# Patient Record
Sex: Female | Born: 1965 | Race: White | Hispanic: No | Marital: Married | Smoking: Never smoker
Health system: Southern US, Community
[De-identification: ages and names within clinical notes are randomized; demographics above are authoritative.]

## PROBLEM LIST (undated history)

## (undated) DIAGNOSIS — D649 Anemia, unspecified: Secondary | ICD-10-CM

## (undated) DIAGNOSIS — E042 Nontoxic multinodular goiter: Secondary | ICD-10-CM

## (undated) DIAGNOSIS — D219 Benign neoplasm of connective and other soft tissue, unspecified: Secondary | ICD-10-CM

## (undated) DIAGNOSIS — F419 Anxiety disorder, unspecified: Secondary | ICD-10-CM

## (undated) HISTORY — DX: Anxiety disorder, unspecified: F41.9

## (undated) HISTORY — DX: Benign neoplasm of connective and other soft tissue, unspecified: D21.9

## (undated) HISTORY — PX: DILATION AND CURETTAGE OF UTERUS: SHX78

## (undated) HISTORY — PX: HYSTEROSCOPY: SHX211

## (undated) HISTORY — DX: Nontoxic multinodular goiter: E04.2

---

## 1997-09-01 ENCOUNTER — Other Ambulatory Visit: Admission: RE | Admit: 1997-09-01 | Discharge: 1997-09-01 | Payer: Self-pay | Admitting: Gynecology

## 1999-06-08 ENCOUNTER — Other Ambulatory Visit: Admission: RE | Admit: 1999-06-08 | Discharge: 1999-06-08 | Payer: Self-pay | Admitting: Gynecology

## 1999-07-03 ENCOUNTER — Encounter: Admission: RE | Admit: 1999-07-03 | Discharge: 1999-07-03 | Payer: Self-pay | Admitting: Gynecology

## 1999-07-03 ENCOUNTER — Encounter: Payer: Self-pay | Admitting: Gynecology

## 2000-07-30 ENCOUNTER — Other Ambulatory Visit: Admission: RE | Admit: 2000-07-30 | Discharge: 2000-07-30 | Payer: Self-pay | Admitting: Gynecology

## 2001-11-11 ENCOUNTER — Other Ambulatory Visit: Admission: RE | Admit: 2001-11-11 | Discharge: 2001-11-11 | Payer: Self-pay | Admitting: Gynecology

## 2002-12-28 ENCOUNTER — Other Ambulatory Visit: Admission: RE | Admit: 2002-12-28 | Discharge: 2002-12-28 | Payer: Self-pay | Admitting: Gynecology

## 2004-01-03 ENCOUNTER — Other Ambulatory Visit: Admission: RE | Admit: 2004-01-03 | Discharge: 2004-01-03 | Payer: Self-pay | Admitting: Gynecology

## 2004-02-18 ENCOUNTER — Ambulatory Visit (HOSPITAL_COMMUNITY): Admission: RE | Admit: 2004-02-18 | Discharge: 2004-02-18 | Payer: Self-pay | Admitting: Dermatology

## 2004-02-18 ENCOUNTER — Ambulatory Visit (HOSPITAL_BASED_OUTPATIENT_CLINIC_OR_DEPARTMENT_OTHER): Admission: RE | Admit: 2004-02-18 | Discharge: 2004-02-18 | Payer: Self-pay | Admitting: Dermatology

## 2004-02-18 ENCOUNTER — Encounter (INDEPENDENT_AMBULATORY_CARE_PROVIDER_SITE_OTHER): Payer: Self-pay | Admitting: Specialist

## 2004-03-02 ENCOUNTER — Encounter: Admission: RE | Admit: 2004-03-02 | Discharge: 2004-03-02 | Payer: Self-pay | Admitting: Gynecology

## 2005-01-30 ENCOUNTER — Other Ambulatory Visit: Admission: RE | Admit: 2005-01-30 | Discharge: 2005-01-30 | Payer: Self-pay | Admitting: Gynecology

## 2005-03-21 ENCOUNTER — Encounter: Admission: RE | Admit: 2005-03-21 | Discharge: 2005-03-21 | Payer: Self-pay | Admitting: Gynecology

## 2006-04-01 ENCOUNTER — Encounter: Admission: RE | Admit: 2006-04-01 | Discharge: 2006-04-01 | Payer: Self-pay | Admitting: Gynecology

## 2007-04-09 ENCOUNTER — Encounter: Admission: RE | Admit: 2007-04-09 | Discharge: 2007-04-09 | Payer: Self-pay | Admitting: Obstetrics and Gynecology

## 2007-04-16 ENCOUNTER — Encounter: Admission: RE | Admit: 2007-04-16 | Discharge: 2007-04-16 | Payer: Self-pay | Admitting: Obstetrics and Gynecology

## 2007-11-20 ENCOUNTER — Encounter: Admission: RE | Admit: 2007-11-20 | Discharge: 2007-11-20 | Payer: Self-pay | Admitting: Obstetrics and Gynecology

## 2008-04-09 ENCOUNTER — Encounter: Admission: RE | Admit: 2008-04-09 | Discharge: 2008-04-09 | Payer: Self-pay | Admitting: Obstetrics and Gynecology

## 2009-04-18 ENCOUNTER — Encounter: Admission: RE | Admit: 2009-04-18 | Discharge: 2009-04-18 | Payer: Self-pay | Admitting: Obstetrics and Gynecology

## 2010-05-11 ENCOUNTER — Other Ambulatory Visit: Payer: Self-pay | Admitting: Obstetrics and Gynecology

## 2010-05-11 DIAGNOSIS — Z1231 Encounter for screening mammogram for malignant neoplasm of breast: Secondary | ICD-10-CM

## 2010-05-30 ENCOUNTER — Ambulatory Visit
Admission: RE | Admit: 2010-05-30 | Discharge: 2010-05-30 | Disposition: A | Discharge status: Home / Self Care | Payer: BC Managed Care – PPO | Source: Ambulatory Visit | Attending: Obstetrics and Gynecology | Admitting: Obstetrics and Gynecology

## 2010-05-30 DIAGNOSIS — Z1231 Encounter for screening mammogram for malignant neoplasm of breast: Secondary | ICD-10-CM

## 2010-08-18 NOTE — Op Note (Signed)
[name], Mikayla Greer          ACCOUNT NO.:  [id number]   MEDICAL RECORD NO.:  [id number]          PATIENT TYPE:  AMB   LOCATION:  [facility]                         FACILITY:  [facility]   PHYSICIAN:  Gretta Cool, M.D. DATE OF BIRTH:  16-Jun-1965   DATE OF PROCEDURE:  02/18/2004  DATE OF DISCHARGE:                                 OPERATIVE REPORT   PREOPERATIVE DIAGNOSIS:  Abnormal uterine bleeding with endometrial polyps  and submucous leiomyomata.   POSTOPERATIVE DIAGNOSIS:  Abnormal uterine bleeding with endometrial polyps  and submucous leiomyomata.   PROCEDURE:  Hysteroscopy with resection of endometrial polyps and multiple  uterine leiomyomata.   SURGEON:  Gretta Cool, M.D.   ANESTHESIA:  IV sedation, paracervical block.   DESCRIPTION OF PROCEDURE:  Under excellent anesthesia, as above, with the  patient prepped and draped in Allen stirrups in the lithotomy position, the  cervix was progressively dilated with a series of Pratt dilators to  accommodate the 7 mm resectoscope.  The cavity was then examined with the  resectoscope and photographed.  Endometrial polyps were identified near the  fundus of the uterus.  The endometrial polyps were progressively resected  from anterior and cornual area to eventually posterior.  During the course  of resection of multiple polyps, multiple fibroids, varying from pearl-sized  to a very large fibroid on the posterior fundal wall measuring at least 2-3  cm across.  The fibroid was progressively resected and continued to bulge  out into the endometrial cavity.  Resection was continued inside the capsule  until very thin layer of fibroid remained.  At this point, rather than risk  perforation of the uterus through the very apex of the fundus, the fibroid  was treated by cautery, so as to destroy by heat transfer of the fibroid, in  hopes of reaching 110 degrees throughout the fibroid without perforating the  uterus.  Once the fibroid  was so treated, the entire endometrial cavity was  treated with VaporTrode with touch technique over the cornual areas.  The  entire endometrial cavity was then examined at reduced pressure, and  bleeding points treated by cautery.  At the end of the procedure, the  bleeding was minimal at 30 mm pressure.  The fluid deficit was less than 70  cc.   COMPLICATIONS:  None.      CWL/MEDQ  D:  02/18/2004  T:  02/18/2004  Job:  [id number]   cc:   [facility]

## 2011-04-26 ENCOUNTER — Other Ambulatory Visit: Payer: Self-pay | Admitting: Obstetrics and Gynecology

## 2011-04-26 DIAGNOSIS — Z1231 Encounter for screening mammogram for malignant neoplasm of breast: Secondary | ICD-10-CM

## 2011-06-05 ENCOUNTER — Ambulatory Visit
Admission: RE | Admit: 2011-06-05 | Discharge: 2011-06-05 | Disposition: A | Discharge status: Home / Self Care | Payer: PRIVATE HEALTH INSURANCE | Source: Ambulatory Visit | Attending: Obstetrics and Gynecology | Admitting: Obstetrics and Gynecology

## 2011-06-05 DIAGNOSIS — Z1231 Encounter for screening mammogram for malignant neoplasm of breast: Secondary | ICD-10-CM

## 2012-05-20 ENCOUNTER — Other Ambulatory Visit: Payer: Self-pay | Admitting: Obstetrics and Gynecology

## 2012-05-20 DIAGNOSIS — Z1231 Encounter for screening mammogram for malignant neoplasm of breast: Secondary | ICD-10-CM

## 2012-05-28 ENCOUNTER — Other Ambulatory Visit: Payer: Self-pay

## 2012-05-28 DIAGNOSIS — Z1231 Encounter for screening mammogram for malignant neoplasm of breast: Secondary | ICD-10-CM

## 2012-06-09 ENCOUNTER — Ambulatory Visit: Payer: PRIVATE HEALTH INSURANCE

## 2012-06-25 ENCOUNTER — Ambulatory Visit: Payer: PRIVATE HEALTH INSURANCE

## 2012-07-22 ENCOUNTER — Ambulatory Visit
Admission: RE | Admit: 2012-07-22 | Discharge: 2012-07-22 | Disposition: A | Discharge status: Home / Self Care | Payer: BC Managed Care – PPO | Source: Ambulatory Visit

## 2012-07-22 DIAGNOSIS — Z1231 Encounter for screening mammogram for malignant neoplasm of breast: Secondary | ICD-10-CM

## 2012-10-09 ENCOUNTER — Ambulatory Visit: Payer: Self-pay | Admitting: Obstetrics and Gynecology

## 2012-10-17 ENCOUNTER — Ambulatory Visit (INDEPENDENT_AMBULATORY_CARE_PROVIDER_SITE_OTHER): Payer: BC Managed Care – PPO | Admitting: Obstetrics and Gynecology

## 2012-10-17 ENCOUNTER — Encounter: Payer: Self-pay | Admitting: Obstetrics and Gynecology

## 2012-10-17 VITALS — BP 120/80 | HR 80 | Ht 65.0 in | Wt 146.0 lb

## 2012-10-17 DIAGNOSIS — Z0000 Encounter for general adult medical examination without abnormal findings: Secondary | ICD-10-CM

## 2012-10-17 DIAGNOSIS — D219 Benign neoplasm of connective and other soft tissue, unspecified: Secondary | ICD-10-CM

## 2012-10-17 DIAGNOSIS — Z01419 Encounter for gynecological examination (general) (routine) without abnormal findings: Secondary | ICD-10-CM

## 2012-10-17 DIAGNOSIS — D259 Leiomyoma of uterus, unspecified: Secondary | ICD-10-CM

## 2012-10-17 DIAGNOSIS — N920 Excessive and frequent menstruation with regular cycle: Secondary | ICD-10-CM

## 2012-10-17 LAB — CBC
HCT: 31.3 % — ABNORMAL LOW (ref 36.0–46.0)
MCH: 25.6 pg — ABNORMAL LOW (ref 26.0–34.0)
MCV: 81.7 fL (ref 78.0–100.0)
RBC: 3.83 MIL/uL — ABNORMAL LOW (ref 3.87–5.11)
RDW: 16.1 % — ABNORMAL HIGH (ref 11.5–15.5)
WBC: 6 10*3/uL (ref 4.0–10.5)

## 2012-10-17 LAB — COMPREHENSIVE METABOLIC PANEL
ALT: 11 U/L (ref 0–35)
Albumin: 4.4 g/dL (ref 3.5–5.2)
Alkaline Phosphatase: 51 U/L (ref 39–117)
Potassium: 4.3 mEq/L (ref 3.5–5.3)
Sodium: 138 mEq/L (ref 135–145)
Total Bilirubin: 0.6 mg/dL (ref 0.3–1.2)
Total Protein: 6.5 g/dL (ref 6.0–8.3)

## 2012-10-17 LAB — LIPID PANEL
HDL: 83 mg/dL (ref >39)
LDL Cholesterol: 101 mg/dL — ABNORMAL HIGH (ref 0–99)
Total CHOL/HDL Ratio: 2.3 Ratio

## 2012-10-17 LAB — POCT URINALYSIS DIPSTICK
Blood, UA: NEGATIVE
Glucose, UA: NEGATIVE
Nitrite, UA: NEGATIVE
Urobilinogen, UA: NEGATIVE
pH, UA: 5

## 2012-10-17 NOTE — Patient Instructions (Signed)

## 2012-10-17 NOTE — Progress Notes (Signed)
Patient ID: Mikayla Greer, female   DOB: 29-Dec-1965, 47 y.o.   MRN: [id number] 47 y.o.   Married    Caucasian   female   G1P1001   here for annual exam.   Menses very heavy.  Light menses April.  None in May.  June and July heavy flow with clots.  Has one day of sitting basically on the toilet.  Cannot even use tampons.  Cannot drive to work without bleeding through.   Takes Advil for the cramps.  This does not help the bleeding.  Had a hysteroscopy with myomectomy 6 - 7 years ago.  Hot flashes and night sweats.  Drenches at night. Jeffie Pollock does help this.    Lot 10 pounds on purpose.  Running and walking.  Patient's last menstrual period was 10/08/2012.          Sexually active: yes  The current method of family planning is vasectomy.    Exercising: walking and running Last mammogram:  05/2012 wnl:[facility] Last pap smear: 09/2011 wnl History of abnormal pap: no Smoking: no Alcohol: 1 beer per week Last colonoscopy: never Last Bone Density:  never Last tetanus shot: 02/2012 Last cholesterol check: 2-3 yrs ago:[facility]  Hgb:                Urine: Neg   Family History  Problem Relation Age of Onset  . Rheum arthritis Mother   . Breast cancer Paternal Grandmother     There are no active problems to display for this patient.   Past Medical History  Diagnosis Date  . Anxiety   . Fibroid     Past Surgical History  Procedure Laterality Date  . Hysteroscopy      Allergies: Review of patient's allergies indicates not on file.  Current Outpatient Prescriptions  Medication Sig Dispense Refill  . ALPRAZolam (XANAX) 0.5 MG tablet Take 1 tablet by mouth as needed.      . calcium carbonate (OS-CAL) 600 MG TABS Take 600 mg by mouth 2 (two) times daily with a meal.      . escitalopram (LEXAPRO) 20 MG tablet Take 1 tablet by mouth daily.      . Multiple Vitamin (MULTIVITAMIN) capsule Take 1 capsule by mouth daily.      . Nutritional Supplements (ESTROVEN ENERGY PO) Take  1 capsule by mouth 2 (two) times daily.       No current facility-administered medications for this visit.    ROS: Pertinent items are noted in HPI.  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  Exam:    BP 120/80  Pulse 80  Ht 5\' 5"  (1.651 m)  Wt 146 lb (66.225 kg)  BMI 24.3 kg/m2  LMP 10/08/2012   Wt Readings from Last 3 Encounters:  10/17/12 146 lb (66.225 kg)     Ht Readings from Last 3 Encounters:  10/17/12 5\' 5"  (1.651 m)    General appearance: alert, cooperative and appears stated age Head: Normocephalic, without obvious abnormality, atraumatic Neck: no adenopathy, supple, symmetrical, trachea midline and thyroid not enlarged, symmetric, no tenderness/mass/nodules Lungs: clear to auscultation bilaterally Breasts: Inspection negative, No nipple retraction or dimpling, No nipple discharge or bleeding, No axillary or supraclavicular adenopathy, Normal to palpation without dominant masses Heart: regular rate and rhythm Abdomen: soft, non-tender; no masses,  no organomegaly Extremities: extremities normal, atraumatic, no cyanosis or edema Skin: Skin color, texture, turgor normal. No rashes or lesions Lymph nodes: Cervical, supraclavicular, and axillary nodes normal. No abnormal  inguinal nodes palpated Neurologic: Grossly normal   Pelvic: External genitalia:  no lesions              Urethra:  normal appearing urethra with no masses, tenderness or lesions              Bartholins and Skenes: normal                 Vagina: normal appearing vagina with normal color and discharge, no lesions              Cervix: normal appearance              Pap taken: no        Bimanual Exam:  Uterus:  uterus is  10 week size - retroverted.  Large posterior fibroids noted above cervix.                                      Adnexa: normal adnexa in size, nontender and no masses                                      Rectovaginal: Confirms                                      Anus:  normal  sphincter tone, no lesions  Assessment Menorrhagia. Uterine fibroids.     P: mammogram yearly pap smear in 4 years TSH, CBC, lipid profile, CMP Return for pelvic ultrasound and possible sonohysterogram and endometrial biopsy return annually or prn     An After Visit Summary was printed and given to the patient.

## 2012-10-22 ENCOUNTER — Telehealth: Payer: Self-pay

## 2012-10-22 NOTE — Telephone Encounter (Signed)
LMOVM at HM# and Cell # to call for test results. 

## 2012-10-22 NOTE — Telephone Encounter (Signed)
Message copied by Alphonsa Overall on Wed Oct 22, 2012 10:23 AM ------      Message from: Conley Simmonds      Created: Mon Oct 20, 2012  8:35 AM       Please inform patient of results.            She is anemic due to her heavy menstrual bleeding.  The rest of the blood work is normal.            I recommend over the counter iron sulfate 325 mg by mouth twice a day with meals.            She will return for her pelvic ultrasound to investigate her fibroids further. ------

## 2012-10-22 NOTE — Telephone Encounter (Signed)
Patient notified of anemia and advised to begin ferrous sulfate 325mg  twice daily and keep ultrasound appt.

## 2012-10-22 NOTE — Telephone Encounter (Signed)
Returned call

## 2012-10-27 ENCOUNTER — Telehealth: Payer: Self-pay | Admitting: Obstetrics and Gynecology

## 2012-10-27 NOTE — Telephone Encounter (Signed)
LMTCB to discuss insurance benefits for pus-shgm/endo bx and to schedule.

## 2012-11-11 ENCOUNTER — Telehealth: Payer: Self-pay | Admitting: Obstetrics and Gynecology

## 2012-11-11 NOTE — Telephone Encounter (Signed)
Patient has an appointment on Thursday  for Ultrasound . Last period was July. Needs to keep this appointment or what should she do?

## 2012-11-12 ENCOUNTER — Telehealth: Payer: Self-pay | Admitting: Obstetrics and Gynecology

## 2012-11-12 NOTE — Telephone Encounter (Signed)
Call to patient, LMTCB regarding SHGM appt.  47 y/o with menopausal symptoms and irregular heavy bleeding.  Husband with vasectomy.  Sched for [facility] tomm.

## 2012-11-12 NOTE — Telephone Encounter (Signed)
Patient needs to know if she needs to keep her appointment for her Ultrasound . Because she hasn't started her period . Needs to know something today . Appointment scheduled for tomorrow at 8:30am.

## 2012-11-12 NOTE — Telephone Encounter (Signed)
Left message on CB#VM of need to return call concerning appt. Tomorrow.

## 2012-11-12 NOTE — Telephone Encounter (Signed)
Patient returned my call, has not started menses and was concerned that she wasn't on the correct cycle day to have procedure.  Per Dr Edward Jolly, [facility] to have procedure as scheduled.

## 2012-11-12 NOTE — Telephone Encounter (Signed)
Patient calling to let know that she has not started her cycle for this month and no symptoms as of now that she is . Patient advised to keep appt. For tomorrow for procedures to be done. Patient to call in morning if starts her cycle.

## 2012-11-13 ENCOUNTER — Ambulatory Visit (INDEPENDENT_AMBULATORY_CARE_PROVIDER_SITE_OTHER): Payer: BC Managed Care – PPO

## 2012-11-13 ENCOUNTER — Encounter: Payer: Self-pay | Admitting: Obstetrics and Gynecology

## 2012-11-13 ENCOUNTER — Ambulatory Visit (INDEPENDENT_AMBULATORY_CARE_PROVIDER_SITE_OTHER): Payer: BC Managed Care – PPO | Admitting: Obstetrics and Gynecology

## 2012-11-13 VITALS — BP 120/76 | HR 80 | Ht 65.0 in | Wt 150.5 lb

## 2012-11-13 DIAGNOSIS — N920 Excessive and frequent menstruation with regular cycle: Secondary | ICD-10-CM

## 2012-11-13 DIAGNOSIS — D219 Benign neoplasm of connective and other soft tissue, unspecified: Secondary | ICD-10-CM

## 2012-11-13 DIAGNOSIS — D259 Leiomyoma of uterus, unspecified: Secondary | ICD-10-CM

## 2012-11-13 NOTE — Progress Notes (Signed)
Transvaginal ultrasound

## 2012-11-13 NOTE — Patient Instructions (Signed)
Our precertification department will contact you after we receive authorization for the hysterectomy from your insurance company.

## 2012-11-13 NOTE — Progress Notes (Signed)
Patient ID: Mikayla Greer, female   DOB: Oct 16, 1965, 47 y.o.   MRN: [id number]  Subjective  Patient here for pelvic ultrasound, sonohysterogram, and endometrial biopsy. Menorrhagia and anemia. History of uterine fibroids.  Is status post hysteroscopic myomectomy. Has daily pelvic pressure. Notes dsypareunia.    Objective    See ultrasound report  Uterus with multiple fibroids, larges in 4.6 cm posterior and distorts endometrial cavity. EMS 10.73 mm - distorted and cannot rule out endometrial mass 15 mm. Right ovary with thick walled cyst 3.6 cm, echo free and avascular. Left ovary with multicystic mass 4.8 cm avascular and calcifications and low level echos suggestive of dermoid cyst.  Procedures  Consent for sonohysterogram and endometrial biopsy  Open sided speculum. Sterile prep of the cervix with betadine. Tenaculum to anterior cervix. Attempt to pass canula for saline infusion, met with resistance. Attempt to pass Pipelle also met with resistance and caused vaginal bleeding.  Both sonohysterogram and endometrial biopsies aborted.  Assessment  Multifibroid uterus - symptomatic. Anemia.  Bilateral ovarian cysts, possible left dermoid cyst.  Plan  I discussed options for care with the patient.   Will plan for abdominal hysterectomy with possible bilateral salpingo-oophorectomy.  Risks, benefits, and alternatives discussed with the patient.  The posterior uterine fibroid is distorting the uterus so that it would be difficult to place a uterine manipulator in order to perform the surgery laparoscopically.  I have discussed risks which include but are not limited to bleeding, infection, trauma to surrounding organs, DVT, PE, reaction to anesthesia, death, need for reoperation, and menopausal symptoms if hormone therapy were not given post op.  Patient would like to proceed.

## 2012-12-02 ENCOUNTER — Encounter: Payer: Self-pay | Admitting: Gynecology

## 2012-12-04 ENCOUNTER — Telehealth: Payer: Self-pay | Admitting: *Deleted

## 2012-12-04 NOTE — Telephone Encounter (Signed)
Patient notified of surgery date of 02-03-13 at 0730 at [facility].  Instructions given and copy mailed. Pre/post op appts scheduled.  What bowel prep for this patient?

## 2012-12-05 NOTE — Telephone Encounter (Signed)
No bowel prep needed.

## 2012-12-08 NOTE — Telephone Encounter (Signed)
VM confirms "Rocky Link and Jen Mow", LM on VM, Dr Rica Records office calling to clarify no bowel prep needed.  Call back if questions.

## 2013-01-12 ENCOUNTER — Ambulatory Visit (INDEPENDENT_AMBULATORY_CARE_PROVIDER_SITE_OTHER): Payer: BC Managed Care – PPO | Admitting: Obstetrics and Gynecology

## 2013-01-12 ENCOUNTER — Encounter: Payer: Self-pay | Admitting: Obstetrics and Gynecology

## 2013-01-12 VITALS — BP 110/68 | HR 84 | Ht 65.0 in | Wt 151.5 lb

## 2013-01-12 DIAGNOSIS — D219 Benign neoplasm of connective and other soft tissue, unspecified: Secondary | ICD-10-CM

## 2013-01-12 DIAGNOSIS — D259 Leiomyoma of uterus, unspecified: Secondary | ICD-10-CM

## 2013-01-12 DIAGNOSIS — E041 Nontoxic single thyroid nodule: Secondary | ICD-10-CM | POA: Insufficient documentation

## 2013-01-12 LAB — IRON: Iron: 33 ug/dL — ABNORMAL LOW (ref 42–145)

## 2013-01-12 LAB — CBC
HCT: 33.6 % — ABNORMAL LOW (ref 36.0–46.0)
Hemoglobin: 10.7 g/dL — ABNORMAL LOW (ref 12.0–15.0)
RBC: 4.09 MIL/uL (ref 3.87–5.11)
WBC: 4.9 10*3/uL (ref 4.0–10.5)

## 2013-01-12 NOTE — Progress Notes (Signed)
Patient ID: Mikayla Greer, female   DOB: Oct 24, 1965, 47 y.o.   MRN: [id number] GYNECOLOGY PROBLEM VISIT  PCP:  [facility]  Referring provider:   HPI: 47 y.o.   Married  Caucasian  female   G1P1001 with LMP 12/20/12, very heavy.   here for Discuss surgery on 02/03/13  Scheduled for a total abdominal hysterectomy with possible bilateral salpingo-oophorectomy. Heavy cycles and known fibroids. History of hysteroscopic myomectomy. Vasectomy for birth control.   Hgb 9.8 in July.  Taking some iron but has GI upset from this.  Was taking ferrous sulfate.    Ultrasound, sonohysterogram and endometrial biopsy attempt on 11/13/12 -   Uterus with multiple fibroids, larges in 4.6 cm posterior and distorts endometrial cavity.  EMS 10.73 mm - distorted and cannot rule out endometrial mass 15 mm.  Right ovary with thick walled cyst 3.6 cm, echo free and avascular.  Left ovary with multicystic mass 4.8 cm avascular and calcifications and low level echos suggestive of dermoid cyst.  No EMP possible secondary to distortion of the endometrial cavity.   Sometimes had urinary incontinence if coughs hard.    GYNECOLOGIC HISTORY: LMP: 12-20-12 Sexually active:  yes Partner preference: female Contraception:   vasectomy Menopausal hormone therapy: no DES exposure:  no  Blood transfusions:  no  Sexually transmitted diseases:   no GYN Procedures:  Hysteroscopy 2006 Mammogram:  05/2012 wnl:[facility]              Pap: 09/2011 wnl   History of abnormal pap smear:  no   OB History   Grav Para Term Preterm Abortions TAB SAB Ect Mult Living   1 1 1       1          Family History  Problem Relation Age of Onset  . Rheum arthritis Mother   . Breast cancer Paternal Grandmother     There are no active problems to display for this patient.   Past Medical History  Diagnosis Date  . Anxiety   . Fibroid     Past Surgical History  Procedure Laterality Date  . Hysteroscopy       ALLERGIES: Review of patient's allergies indicates no known allergies.  Current Outpatient Prescriptions  Medication Sig Dispense Refill  . ALPRAZolam (XANAX) 0.5 MG tablet Take 1 tablet by mouth as needed.      . calcium carbonate (OS-CAL) 600 MG TABS Take 600 mg by mouth 2 (two) times daily with a meal.      . escitalopram (LEXAPRO) 20 MG tablet Take 1 tablet by mouth daily.      . ferrous sulfate 325 (65 FE) MG tablet Take 325 mg by mouth at bedtime.      . Multiple Vitamin (MULTIVITAMIN) capsule Take 1 capsule by mouth daily.      . Nutritional Supplements (ESTROVEN ENERGY PO) Take 1 capsule by mouth 2 (two) times daily.       No current facility-administered medications for this visit.     ROS:  Pertinent items are noted in HPI.  SOCIAL HISTORY:  Married.   PHYSICAL EXAMINATION:    BP 110/68  Pulse 84  Ht 5\' 5"  (1.651 m)  Wt 151 lb 8 oz (68.72 kg)  BMI 25.21 kg/m2   Wt Readings from Last 3 Encounters:  01/12/13 151 lb 8 oz (68.72 kg)  11/13/12 150 lb 8 oz (68.266 kg)  10/17/12 146 lb (66.225 kg)     Ht Readings  from Last 3 Encounters:  01/12/13 5\' 5"  (1.651 m)  11/13/12 5\' 5"  (1.651 m)  10/17/12 5\' 5"  (1.651 m)    General appearance: alert, cooperative and appears stated age Head: Normocephalic, without obvious abnormality, atraumatic Neck: no adenopathy, supple, symmetrical, trachea midline and thyroid not enlarged, symmetric,  2.0 cm right thyroid nodule noted, nontender.  Lungs: clear to auscultation bilaterally Breasts: Inspection negative, No nipple retraction or dimpling, No nipple discharge or bleeding, No axillary or supraclavicular adenopathy, Normal to palpation without dominant masses Heart: regular rate and rhythm Abdomen: soft, non-tender; no masses,  no organomegaly Extremities: extremities normal, atraumatic, no cyanosis or edema Skin: Skin color, texture, turgor normal. No rashes or lesions Lymph nodes: Cervical, supraclavicular, and axillary  nodes normal. No abnormal inguinal nodes palpated Neurologic: Grossly normal  Pelvic: External genitalia:  no lesions              Urethra:  normal appearing urethra with no masses, tenderness or lesions              Bartholins and Skenes: normal                 Vagina: normal appearing vagina with normal color and discharge, no lesions              Cervix: normal appearance, only posterior lip seen.                   Bimanual Exam:  Uterus:  uterus is 10 week size with a large posterior fibroid. Nontender                                      Adnexa: normal adnexa in size, nontender and no masses                                      Rectovaginal: Confirms                                      Anus:  normal sphincter tone, no lesions  ASSESSMENT    Multifibroid uterus - symptomatic.  Anemia.  Bilateral ovarian cysts, possible left dermoid cyst.  Right thyroid nodule.    PLAN  Will check thyroid function studies, CBC.  Will schedule thyroid ultrasound.    Will plan for abdominal hysterectomy with bilateral salpingo-oophorectomy. Risks, benefits, and alternatives discussed with the patient. The posterior uterine fibroid is distorting the uterus so that it would be difficult to place a uterine manipulator in order to perform the surgery laparoscopically. I have discussed risks which include but are not limited to bleeding, infection, trauma to surrounding organs, incisional herniation, DVT, PE, reaction to anesthesia, death, need for reoperation, and menopausal symptoms if hormone therapy were not given post op. Patient would like to proceed.   An after visit summary was provided to the patient.

## 2013-01-12 NOTE — Progress Notes (Signed)
U/S Neck/Soft Tissue scheduled for 01/13/13 at 3:15 at [facility] scheduled with patient in office. Patient agreeable to appointment time/place.

## 2013-01-12 NOTE — Patient Instructions (Signed)
Thyroid Diseases  Your thyroid is a butterfly-shaped gland in your neck. It is located just above your collarbone. It is one of your endocrine glands, which make hormones. The thyroid helps set your metabolism. Metabolism is how your body gets energy from the foods you eat.   Millions of people have thyroid diseases. Women experience thyroid problems more often than men. In fact, overactive thyroid problems (hyperthyroidism) occur in 1% of all women. If you have a thyroid disease, your body may use energy more slowly or quickly than it should.   Thyroid problems also include an immune disease where your body reacts against your thyroid gland (called thyroiditis). A different problem involves lumps and bumps (called nodules) that develop in the gland. The nodules are usually, but not always, noncancerous.  THE MOST COMMON THYROID PROBLEMS AND CAUSES ARE DISCUSSED BELOW  There are many causes for thyroid problems. Treatment depends upon the exact diagnosis and includes trying to reset your body's metabolism to a normal rate.  Hyperthyroidism  Too much thyroid hormone from an overactive thyroid gland is called hyperthyroidism. In hyperthyroidism, the body's metabolism speeds up. One of the most frequent forms of hyperthyroidism is known as Graves' disease. Graves' disease tends to run in families. Although Graves' is thought to be caused by a problem with the immune system, the exact nature of the genetic problem is unknown.  Hypothyroidism  Too little thyroid hormone from an underactive thyroid gland is called hypothyroidism. In hypothyroidism, the body's metabolism is slowed. Several things can cause this condition. Most causes affect the thyroid gland directly and hurt its ability to make enough hormone.   Rarely, there may be a pituitary gland tumor (located near the base of the brain). The tumor can block the pituitary from producing thyroid-stimulating hormone (TSH). Your body makes TSH to stimulate the thyroid  to work properly. If the pituitary does not make enough TSH, the thyroid fails to make enough hormones needed for good health.  Whether the problem is caused by thyroid conditions or by the pituitary gland, the result is that the thyroid is not making enough hormones. Hypothyroidism causes many physical and mental processes to become sluggish. The body consumes less oxygen and produces less body heat.  Thyroid Nodules  A thyroid nodule is a small swelling or lump in the thyroid gland. They are common. These nodules represent either a growth of thyroid tissue or a fluid-filled cyst. Both form a lump in the thyroid gland. Almost half of all people will have tiny thyroid nodules at some point in their lives. Typically, these are not noticeable until they become large and affect normal thyroid size. Larger nodules that are greater than a half inch across (about 1 centimeter) occur in about 5 percent of people.  Although most nodules are not cancerous, people who have them should seek medical care to rule out cancer. Also, some thyroid nodules may produce too much thyroid hormone or become too large. Large nodules or a large gland can interfere with breathing or swallowing or may cause neck discomfort.  Other problems  Other thyroid problems include cancer and thyroiditis. Thyroiditis is a malfunction of the body's immune system. Normally, the immune system works to defend the body against infection and other problems. When the immune system is not working properly, it may mistakenly attack normal cells, tissues, and organs. Examples of autoimmune diseases are Hashimoto's thyroiditis (which causes low thyroid function) and Graves' disease (which causes excess thyroid function).  SYMPTOMS   Symptoms   vary greatly depending upon the exact type of problem with the thyroid.  Hyperthyroidism-is when your thyroid is too active and makes more thyroid hormone than your body needs. The most common cause is Graves' Disease. Too  much thyroid hormone can cause some or all of the following symptoms:  · Anxiety.  · Irritability.  · Difficulty sleeping.  · Fatigue.  · A rapid or irregular heartbeat.  · A fine tremor of your hands or fingers.  · An increase in perspiration.  · Sensitivity to heat.  · Weight loss, despite normal food intake.  · Brittle hair.  · Enlargement of your thyroid gland (goiter).  · Light menstrual periods.  · Frequent bowel movements.  Graves' disease can specifically cause eye and skin problems. The skin problems involve reddening and swelling of the skin, often on your shins and on the top of your feet. Eye problems can include the following:  · Excess tearing and sensation of grit or sand in either or both eyes.  · Reddened or inflamed eyes.  · Widening of the space between your eyelids.  · Swelling of the lids and tissues around the eyes.  · Light sensitivity.  · Ulcers on the cornea.  · Double vision.  · Limited eye movements.  · Blurred or reduced vision.  Hypothyroidism- is when your thyroid gland is not active enough. This is more common than hyperthyroidism. Symptoms can vary a lot depending of the severity of the hormone deficiency. Symptoms may develop over a long period of time and can include several of the following:  · Fatigue.  · Sluggishness.  · Increased sensitivity to cold.  · Constipation.  · Pale, dry skin.  · A puffy face.  · Hoarse voice.  · High blood cholesterol level.  · Unexplained weight gain.  · Muscle aches, tenderness and stiffness.  · Pain, stiffness or swelling in your joints.  · Muscle weakness.  · Heavier than normal menstrual periods.  · Brittle fingernails and hair.  · Depression.  Thyroid Nodules - most do not cause signs or symptoms. Occasionally, some may become so large that you can feel or even see the swelling at the base of your neck. You may realize a lump or swelling is there when you are shaving or putting on makeup. Men might become aware of a nodule when shirt collars  suddenly feel too tight.  Some nodules produce too much thyroid hormone. This can produce the same symptoms as hyperthyroidism (see above).  Thyroid nodules are seldom cancerous. However, a nodule is more likely to be malignant (cancerous) if it:  · Grows quickly or feels hard.  · Causes you to become hoarse or to have trouble swallowing or breathing.  · Causes enlarged lymph nodes under your jaw or in your neck.  DIAGNOSIS   Because there are so many possible thyroid conditions, your caregiver may ask for a number of tests. They will do this in order to narrow down the exact diagnosis. These tests can include:  · Blood and antibody tests.  · Special thyroid scans using small, safe amounts of radioactive iodine.  · Ultrasound of the thyroid gland (particularly if there is a nodule or lump).  · Biopsy. This is usually done with a special needle. A needle biopsy is a procedure to obtain a sample of cells from the thyroid. The tissue will be tested in a lab and examined under a microscope.  TREATMENT   Treatment depends on the exact diagnosis.    Hyperthyroidism  · Beta-blockers help relieve many of the symptoms.  · Anti-thyroid medications prevent the thyroid from making excess hormones.  · Radioactive iodine treatment can destroy overactive thyroid cells. The iodine can permanently decrease the amount of hormone produced.  · Surgery to remove the thyroid gland.  · Treatments for eye problems that come from Graves' disease also include medications and special eye surgery, if felt to be appropriate.  Hypothyroidism  Thyroid replacement with levothyroxine is the mainstay of treatment. Treatment with thyroid replacement is usually lifelong and will require monitoring and adjustment from time to time.  Thyroid Nodules  · Watchful waiting. If a small nodule causes no symptoms or signs of cancer on biopsy, then no treatment may be chosen at first. Re-exam and re-checking blood tests would be the recommended  follow-up.  · Anti-thyroid medications or radioactive iodine treatment may be recommended if the nodules produce too much thyroid hormone (see Treatment for Hyperthyroidism above).  · Alcohol ablation. Injections of small amounts of ethyl alcohol (ethanol) can cause a non-cancerous nodule to shrink in size.  · Surgery (see Treatment for Hyperthyroidism above).  HOME CARE INSTRUCTIONS   · Take medications as instructed.  · Follow through on recommended testing.  SEEK MEDICAL CARE IF:   · You feel that you are developing symptoms of Hyperthyroidism or Hypothyroidism as described above.  · You develop a new lump/nodule in the neck/thyroid area that you had not noticed before.  · You feel that you are having side effects from medicines prescribed.  · You develop trouble breathing or swallowing.  SEEK IMMEDIATE MEDICAL CARE IF:   · You develop a fever of 102° F (38.9° C) or higher.  · You develop severe sweating.  · You develop palpitations and/or rapid heart beat.  · You develop shortness of breath.  · You develop nausea and vomiting.  · You develop extreme shakiness.  · You develop agitation.  · You develop lightheadedness or have a fainting episode.  Document Released: 01/14/2007 Document Revised: 06/11/2011 Document Reviewed: 01/14/2007  [employer]® Patient Information ©2014 [employer].

## 2013-01-13 ENCOUNTER — Ambulatory Visit (HOSPITAL_COMMUNITY)
Admission: RE | Admit: 2013-01-13 | Discharge: 2013-01-13 | Disposition: A | Discharge status: Home / Self Care | Payer: BC Managed Care – PPO | Source: Ambulatory Visit | Attending: Obstetrics and Gynecology | Admitting: Obstetrics and Gynecology

## 2013-01-13 DIAGNOSIS — E041 Nontoxic single thyroid nodule: Secondary | ICD-10-CM | POA: Insufficient documentation

## 2013-01-13 DIAGNOSIS — R599 Enlarged lymph nodes, unspecified: Secondary | ICD-10-CM | POA: Insufficient documentation

## 2013-01-14 ENCOUNTER — Telehealth: Payer: Self-pay | Admitting: Obstetrics and Gynecology

## 2013-01-14 ENCOUNTER — Telehealth: Payer: Self-pay | Admitting: *Deleted

## 2013-01-14 DIAGNOSIS — E041 Nontoxic single thyroid nodule: Secondary | ICD-10-CM

## 2013-01-14 NOTE — Telephone Encounter (Signed)
Phone call to discuss thyroid ultrasound showing solid 4.2 cm right thyroid nodule.  Meets criteria for biopsy.  I had to leave a message on the patient's cell phone for her to return my call.

## 2013-01-14 NOTE — Telephone Encounter (Signed)
Message copied by Alisa Graff on Wed Jan 14, 2013  3:00 PM ------      Message from: Conley Simmonds      Created: Wed Jan 14, 2013  2:17 PM       Kennon Rounds,            I left the patient a message and released her result in My Chart so she will call us back.            I told her to ask for me or you to get the thyroid biopsy scheduled. ------

## 2013-01-14 NOTE — Telephone Encounter (Signed)
Call to [facility] and left message that order is entered for thyroid ultrasound and since patient is scheduled for surgery, would like to schedule soon. LMTCB on tammy VM and order entered.

## 2013-01-15 ENCOUNTER — Other Ambulatory Visit (HOSPITAL_COMMUNITY)
Admission: RE | Admit: 2013-01-15 | Discharge: 2013-01-15 | Disposition: A | Discharge status: Home / Self Care | Payer: BC Managed Care – PPO | Source: Ambulatory Visit | Attending: Interventional Radiology | Admitting: Interventional Radiology

## 2013-01-15 ENCOUNTER — Ambulatory Visit
Admission: RE | Admit: 2013-01-15 | Discharge: 2013-01-15 | Disposition: A | Discharge status: Home / Self Care | Payer: BC Managed Care – PPO | Source: Ambulatory Visit | Attending: Obstetrics and Gynecology | Admitting: Obstetrics and Gynecology

## 2013-01-15 DIAGNOSIS — E041 Nontoxic single thyroid nodule: Secondary | ICD-10-CM | POA: Insufficient documentation

## 2013-01-16 ENCOUNTER — Encounter (HOSPITAL_COMMUNITY): Payer: Self-pay | Admitting: Pharmacist

## 2013-01-19 ENCOUNTER — Telehealth: Payer: Self-pay | Admitting: Obstetrics and Gynecology

## 2013-01-19 DIAGNOSIS — E041 Nontoxic single thyroid nodule: Secondary | ICD-10-CM

## 2013-01-19 NOTE — Telephone Encounter (Signed)
Phone call to patient regarding thyroid biopsy results showing a benign follicular nodule.  Will refer patient to endocrinology for consultation.  She prefers [facility].

## 2013-01-26 ENCOUNTER — Encounter (HOSPITAL_COMMUNITY): Payer: Self-pay

## 2013-01-26 ENCOUNTER — Encounter (HOSPITAL_COMMUNITY)
Admission: RE | Admit: 2013-01-26 | Discharge: 2013-01-26 | Disposition: A | Discharge status: Home / Self Care | Payer: BC Managed Care – PPO | Source: Ambulatory Visit | Attending: Obstetrics and Gynecology | Admitting: Obstetrics and Gynecology

## 2013-01-26 DIAGNOSIS — Z01812 Encounter for preprocedural laboratory examination: Secondary | ICD-10-CM | POA: Insufficient documentation

## 2013-01-26 DIAGNOSIS — Z01818 Encounter for other preprocedural examination: Secondary | ICD-10-CM | POA: Insufficient documentation

## 2013-01-26 HISTORY — DX: Anemia, unspecified: D64.9

## 2013-01-26 LAB — CBC
HCT: 31.4 % — ABNORMAL LOW (ref 36.0–46.0)
MCH: 27.3 pg (ref 26.0–34.0)
MCHC: 30.9 g/dL (ref 30.0–36.0)
MCV: 88.5 fL (ref 78.0–100.0)
Platelets: 376 10*3/uL (ref 150–400)
RBC: 3.55 MIL/uL — ABNORMAL LOW (ref 3.87–5.11)

## 2013-01-26 NOTE — Patient Instructions (Addendum)
   Your procedure is scheduled on: Tuesday, Nov 4th  Enter through the [employer] of [facility] at: 8am Pick up the phone at the desk and dial [phone] and inform us of your arrival.  Please call this number if you have any problems the morning of surgery: [phone]  Remember: Do not eat or drink after midnight: Monday Take these medicines the morning of surgery with a SIP OF WATER:  Lexapro, xanax  Do not wear jewelry, make-up, or FINGER nail polish No metal in your hair or on your body. Do not wear lotions, powders, perfumes. You may wear deodorant.  Please use your CHG wash as directed prior to surgery.  Do not shave anywhere for at least 12 hours prior to first CHG shower.  Do not bring valuables to the hospital. Contacts, dentures or bridgework may not be worn into surgery.  Leave suitcase in the car. After Surgery it may be brought to your room. For patients being admitted to the hospital, checkout time is 11:00am the day of discharge.  Home with husband Rocky Link cell [phone].

## 2013-02-02 NOTE — H&P (Signed)
Progress Notes Info    Chartered loss adjuster Note Status Last Update User Last Update Date/Time    Melony Overly, MD Signed Melony Overly, MD 01/12/2013  8:23 AM     Progress Notes    Patient ID: Mikayla Greer, female   DOB: 13-Jan-1966, 47 y.o.   MRN: [id number] GYNECOLOGY PROBLEM VISIT  PCP:  [facility]  Referring provider:   HPI: 47 y.o.   Married  Caucasian  female    G1P1001 with LMP 12/20/12, very heavy.   here for Discuss surgery on 02/03/13  Scheduled for a total abdominal hysterectomy with possible bilateral salpingo-oophorectomy. Heavy cycles and known fibroids. History of hysteroscopic myomectomy. Vasectomy for birth control.   Hgb 9.8 in July.  Taking some iron but has GI upset from this.  Was taking ferrous sulfate.    Ultrasound, sonohysterogram and endometrial biopsy attempt on 11/13/12 -   Uterus with multiple fibroids, larges in 4.6 cm posterior and distorts endometrial cavity.   EMS 10.73 mm - distorted and cannot rule out endometrial mass 15 mm.   Right ovary with thick walled cyst 3.6 cm, echo free and avascular.   Left ovary with multicystic mass 4.8 cm avascular and calcifications and low level echos suggestive of dermoid cyst.  No EMP possible secondary to distortion of the endometrial cavity.   Sometimes had urinary incontinence if coughs hard.    GYNECOLOGIC HISTORY: LMP: 12-20-12 Sexually active:  yes Partner preference: female Contraception:   vasectomy Menopausal hormone therapy: no DES exposure:  no   Blood transfusions:  no   Sexually transmitted diseases:   no GYN Procedures:  Hysteroscopy 2006 Mammogram:  05/2012 wnl:[facility]               Pap: 09/2011 wnl    History of abnormal pap smear:  no    OB History     Grav  Para  Term  Preterm  Abortions  TAB  SAB  Ect  Mult  Living     1  1  1              1              Family History   Problem  Relation  Age of Onset   .  Rheum arthritis  Mother     .  Breast cancer   Paternal Grandmother       There are no active problems to display for this patient.     Past Medical History   Diagnosis  Date   .  Anxiety     .  Fibroid         Past Surgical History   Procedure  Laterality  Date   .  Hysteroscopy         ALLERGIES: Review of patient's allergies indicates no known allergies.    Current Outpatient Prescriptions   Medication  Sig  Dispense  Refill   .  ALPRAZolam (XANAX) 0.5 MG tablet  Take 1 tablet by mouth as needed.         .  calcium carbonate (OS-CAL) 600 MG TABS  Take 600 mg by mouth 2 (two) times daily with a meal.         .  escitalopram (LEXAPRO) 20 MG tablet  Take 1 tablet by mouth daily.         .  ferrous sulfate 325 (65 FE) MG tablet  Take 325 mg by mouth at bedtime.         Marland Kitchen  Multiple Vitamin (MULTIVITAMIN) capsule  Take 1 capsule by mouth daily.         .  Nutritional Supplements (ESTROVEN ENERGY PO)  Take 1 capsule by mouth 2 (two) times daily.            No current facility-administered medications for this visit.      ROS:  Pertinent items are noted in HPI.  SOCIAL HISTORY:  Married.   PHYSICAL EXAMINATION:    BP 110/68  Pulse 84  Ht 5\' 5"  (1.651 m)  Wt 151 lb 8 oz (68.72 kg)  BMI 25.21 kg/m2    Wt Readings from Last 3 Encounters:   01/12/13  151 lb 8 oz (68.72 kg)   11/13/12  150 lb 8 oz (68.266 kg)   10/17/12  146 lb (66.225 kg)       Ht Readings from Last 3 Encounters:   01/12/13  5\' 5"  (1.651 m)   11/13/12  5\' 5"  (1.651 m)   10/17/12  5\' 5"  (1.651 m)     General appearance: alert, cooperative and appears stated age Head: Normocephalic, without obvious abnormality, atraumatic Neck: no adenopathy, supple, symmetrical, trachea midline and thyroid not enlarged, symmetric,  2.0 cm right thyroid nodule noted, nontender.   Lungs: clear to auscultation bilaterally Breasts: Inspection negative, No nipple retraction or dimpling, No nipple discharge or bleeding, No axillary or supraclavicular adenopathy, Normal to  palpation without dominant masses Heart: regular rate and rhythm Abdomen: soft, non-tender; no masses,  no organomegaly Extremities: extremities normal, atraumatic, no cyanosis or edema Skin: Skin color, texture, turgor normal. No rashes or lesions Lymph nodes: Cervical, supraclavicular, and axillary nodes normal. No abnormal inguinal nodes palpated Neurologic: Grossly normal  Pelvic: External genitalia:  no lesions              Urethra:  normal appearing urethra with no masses, tenderness or lesions              Bartholins and Skenes: normal                  Vagina: normal appearing vagina with normal color and discharge, no lesions              Cervix: normal appearance, only posterior lip seen.                     Bimanual Exam:  Uterus:  uterus is 10 week size with a large posterior fibroid. Nontender                                      Adnexa: normal adnexa in size, nontender and no masses                                      Rectovaginal: Confirms                                      Anus:  normal sphincter tone, no lesions  ASSESSMENT     Multifibroid uterus - symptomatic.   Anemia.   Bilateral ovarian cysts, possible left dermoid cyst.   Right thyroid nodule.    PLAN  Will check thyroid function studies, CBC.   Will schedule thyroid ultrasound.  Will plan for abdominal hysterectomy with bilateral salpingo-oophorectomy. Risks, benefits, and alternatives discussed with the patient. The posterior uterine fibroid is distorting the uterus so that it would be difficult to place a uterine manipulator in order to perform the surgery laparoscopically. I have discussed risks which include but are not limited to bleeding, infection, trauma to surrounding organs, incisional herniation, DVT, PE, reaction to anesthesia, death, need for reoperation, and menopausal symptoms if hormone therapy were not given post op. Patient would like to proceed.    ADDENDEUM  Patient had normal  thyroid function testing and a thyroid ultrasound showing a right solid thyroid nodule measuring 4.2 x 2.2 x 2.2 cm.  Biopsy showing a benign follicular nodule.

## 2013-02-03 ENCOUNTER — Encounter (HOSPITAL_COMMUNITY): Payer: Self-pay | Admitting: Anesthesiology

## 2013-02-03 ENCOUNTER — Inpatient Hospital Stay (HOSPITAL_COMMUNITY): Payer: BC Managed Care – PPO | Admitting: Anesthesiology

## 2013-02-03 ENCOUNTER — Encounter (HOSPITAL_COMMUNITY): Payer: BC Managed Care – PPO | Admitting: Anesthesiology

## 2013-02-03 ENCOUNTER — Inpatient Hospital Stay (HOSPITAL_COMMUNITY)
Admission: RE | Admit: 2013-02-03 | Discharge: 2013-02-05 | DRG: 743 | Disposition: A | Discharge status: Home / Self Care | Payer: BC Managed Care – PPO | Source: Ambulatory Visit | Attending: Obstetrics and Gynecology | Admitting: Obstetrics and Gynecology

## 2013-02-03 ENCOUNTER — Encounter (HOSPITAL_COMMUNITY)
Admission: RE | Disposition: A | Discharge status: Home / Self Care | Payer: Self-pay | Source: Ambulatory Visit | Attending: Obstetrics and Gynecology

## 2013-02-03 DIAGNOSIS — N83209 Unspecified ovarian cyst, unspecified side: Secondary | ICD-10-CM

## 2013-02-03 DIAGNOSIS — D279 Benign neoplasm of unspecified ovary: Principal | ICD-10-CM | POA: Diagnosis present

## 2013-02-03 DIAGNOSIS — N831 Corpus luteum cyst of ovary, unspecified side: Secondary | ICD-10-CM | POA: Diagnosis present

## 2013-02-03 DIAGNOSIS — D259 Leiomyoma of uterus, unspecified: Secondary | ICD-10-CM | POA: Diagnosis present

## 2013-02-03 DIAGNOSIS — N8000 Endometriosis of the uterus, unspecified: Secondary | ICD-10-CM | POA: Diagnosis present

## 2013-02-03 DIAGNOSIS — E041 Nontoxic single thyroid nodule: Secondary | ICD-10-CM | POA: Diagnosis present

## 2013-02-03 HISTORY — PX: SALPINGOOPHORECTOMY: SHX82

## 2013-02-03 HISTORY — PX: ABDOMINAL HYSTERECTOMY: SHX81

## 2013-02-03 LAB — PREGNANCY, URINE: Preg Test, Ur: NEGATIVE

## 2013-02-03 LAB — URINALYSIS, ROUTINE W REFLEX MICROSCOPIC
Bilirubin Urine: NEGATIVE
Ketones, ur: NEGATIVE mg/dL
Leukocytes, UA: NEGATIVE
Nitrite: NEGATIVE
Urobilinogen, UA: 0.2 mg/dL (ref 0.0–1.0)

## 2013-02-03 LAB — BASIC METABOLIC PANEL
BUN: 15 mg/dL (ref 6–23)
Calcium: 9.9 mg/dL (ref 8.4–10.5)
Chloride: 102 mEq/L (ref 96–112)
Creatinine, Ser: 0.72 mg/dL (ref 0.50–1.10)
GFR calc Af Amer: >90 mL/min (ref >90)
GFR calc non Af Amer: >90 mL/min (ref >90)
Glucose, Bld: 99 mg/dL (ref 70–99)

## 2013-02-03 SURGERY — HYSTERECTOMY, ABDOMINAL
Anesthesia: General | Site: Abdomen | Wound class: Clean Contaminated

## 2013-02-03 MED ORDER — NEOSTIGMINE METHYLSULFATE 1 MG/ML IJ SOLN
INTRAMUSCULAR | Status: AC
  Filled 2013-02-03: qty 1

## 2013-02-03 MED ORDER — DEXAMETHASONE SODIUM PHOSPHATE 10 MG/ML IJ SOLN
INTRAMUSCULAR | Status: DC | PRN
  Administered 2013-02-03: 10 mg via INTRAVENOUS

## 2013-02-03 MED ORDER — GLYCOPYRROLATE 0.2 MG/ML IJ SOLN
INTRAMUSCULAR | Status: AC
  Filled 2013-02-03: qty 4

## 2013-02-03 MED ORDER — IBUPROFEN 600 MG PO TABS
600.0000 mg | ORAL_TABLET | Freq: Four times a day (QID) | ORAL | Status: DC | PRN
  Administered 2013-02-04 – 2013-02-05 (×3): 600 mg via ORAL
  Filled 2013-02-03 (×3): qty 1

## 2013-02-03 MED ORDER — HYDROMORPHONE HCL PF 1 MG/ML IJ SOLN
INTRAMUSCULAR | Status: DC | PRN
  Administered 2013-02-03: 0.5 mg via INTRAVENOUS
  Administered 2013-02-03: 1 mg via INTRAVENOUS
  Administered 2013-02-03: 0.5 mg via INTRAVENOUS

## 2013-02-03 MED ORDER — MENTHOL 3 MG MT LOZG: 1.0000 | LOZENGE | OROMUCOSAL | Status: DC | PRN

## 2013-02-03 MED ORDER — ROCURONIUM BROMIDE 100 MG/10ML IV SOLN
INTRAVENOUS | Status: AC
  Filled 2013-02-03: qty 1

## 2013-02-03 MED ORDER — PROPOFOL 10 MG/ML IV EMUL
INTRAVENOUS | Status: AC
  Filled 2013-02-03: qty 20

## 2013-02-03 MED ORDER — LACTATED RINGERS IV SOLN
INTRAVENOUS | Status: DC
  Administered 2013-02-03 – 2013-02-04 (×2): via INTRAVENOUS

## 2013-02-03 MED ORDER — MIDAZOLAM HCL 2 MG/2ML IJ SOLN
INTRAMUSCULAR | Status: AC
  Filled 2013-02-03: qty 2

## 2013-02-03 MED ORDER — ESCITALOPRAM OXALATE 20 MG PO TABS
20.0000 mg | ORAL_TABLET | Freq: Every day | ORAL | Status: DC
  Administered 2013-02-04: 20 mg via ORAL
  Filled 2013-02-03 (×2): qty 1

## 2013-02-03 MED ORDER — KETOROLAC TROMETHAMINE 30 MG/ML IJ SOLN
30.0000 mg | Freq: Four times a day (QID) | INTRAMUSCULAR | Status: AC
  Administered 2013-02-03 – 2013-02-04 (×4): 30 mg via INTRAVENOUS
  Filled 2013-02-03 (×4): qty 1

## 2013-02-03 MED ORDER — KETOROLAC TROMETHAMINE 30 MG/ML IJ SOLN: 30.0000 mg | Freq: Four times a day (QID) | INTRAMUSCULAR | Status: DC

## 2013-02-03 MED ORDER — KETOROLAC TROMETHAMINE 30 MG/ML IJ SOLN: 15.0000 mg | Freq: Once | INTRAMUSCULAR | Status: DC | PRN

## 2013-02-03 MED ORDER — DIPHENHYDRAMINE HCL 50 MG/ML IJ SOLN: 12.5000 mg | Freq: Four times a day (QID) | INTRAMUSCULAR | Status: DC | PRN

## 2013-02-03 MED ORDER — ROCURONIUM BROMIDE 100 MG/10ML IV SOLN
INTRAVENOUS | Status: DC | PRN
  Administered 2013-02-03 (×2): 10 mg via INTRAVENOUS
  Administered 2013-02-03: 50 mg via INTRAVENOUS
  Administered 2013-02-03 (×2): 10 mg via INTRAVENOUS

## 2013-02-03 MED ORDER — ONDANSETRON HCL 4 MG/2ML IJ SOLN: 4.0000 mg | Freq: Four times a day (QID) | INTRAMUSCULAR | Status: DC | PRN

## 2013-02-03 MED ORDER — DIPHENHYDRAMINE HCL 12.5 MG/5ML PO ELIX: 12.5000 mg | ORAL_SOLUTION | Freq: Four times a day (QID) | ORAL | Status: DC | PRN

## 2013-02-03 MED ORDER — FENTANYL CITRATE 0.05 MG/ML IJ SOLN
INTRAMUSCULAR | Status: AC
  Filled 2013-02-03: qty 5

## 2013-02-03 MED ORDER — SODIUM CHLORIDE 0.9 % IJ SOLN: 9.0000 mL | INTRAMUSCULAR | Status: DC | PRN

## 2013-02-03 MED ORDER — MORPHINE SULFATE (PF) 1 MG/ML IV SOLN
INTRAVENOUS | Status: DC
  Administered 2013-02-03: 5 mg via INTRAVENOUS
  Administered 2013-02-03: 7 mg via INTRAVENOUS
  Administered 2013-02-03: 15:00:00 via INTRAVENOUS
  Administered 2013-02-04 (×2): 3 mg via INTRAVENOUS
  Administered 2013-02-04: 5 mg via INTRAVENOUS
  Filled 2013-02-03: qty 25

## 2013-02-03 MED ORDER — LACTATED RINGERS IV SOLN
INTRAVENOUS | Status: DC
  Administered 2013-02-03 (×3): via INTRAVENOUS

## 2013-02-03 MED ORDER — OXYCODONE-ACETAMINOPHEN 5-325 MG PO TABS
1.0000 | ORAL_TABLET | ORAL | Status: DC | PRN
  Administered 2013-02-04: 2 via ORAL
  Administered 2013-02-05: 1 via ORAL
  Filled 2013-02-03: qty 1
  Filled 2013-02-03: qty 2

## 2013-02-03 MED ORDER — DEXTROSE 5 % IV SOLN
2.0000 g | INTRAVENOUS | Status: AC
  Administered 2013-02-03: 2 g via INTRAVENOUS
  Filled 2013-02-03: qty 2

## 2013-02-03 MED ORDER — HYDROMORPHONE HCL PF 1 MG/ML IJ SOLN: 0.2500 mg | INTRAMUSCULAR | Status: DC | PRN

## 2013-02-03 MED ORDER — NALOXONE HCL 0.4 MG/ML IJ SOLN: 0.4000 mg | INTRAMUSCULAR | Status: DC | PRN

## 2013-02-03 MED ORDER — HYDROMORPHONE HCL PF 1 MG/ML IJ SOLN
INTRAMUSCULAR | Status: AC
  Filled 2013-02-03: qty 1

## 2013-02-03 MED ORDER — MIDAZOLAM HCL 2 MG/2ML IJ SOLN
INTRAMUSCULAR | Status: DC | PRN
  Administered 2013-02-03: 2 mg via INTRAVENOUS

## 2013-02-03 MED ORDER — PROMETHAZINE HCL 25 MG/ML IJ SOLN: 6.2500 mg | INTRAMUSCULAR | Status: DC | PRN

## 2013-02-03 MED ORDER — HEPARIN SODIUM (PORCINE) 5000 UNIT/ML IJ SOLN
INTRAMUSCULAR | Status: AC
  Filled 2013-02-03: qty 1

## 2013-02-03 MED ORDER — NEOSTIGMINE METHYLSULFATE 1 MG/ML IJ SOLN
INTRAMUSCULAR | Status: DC | PRN
  Administered 2013-02-03: 4 mg via INTRAVENOUS

## 2013-02-03 MED ORDER — GLYCOPYRROLATE 0.2 MG/ML IJ SOLN
INTRAMUSCULAR | Status: DC | PRN
  Administered 2013-02-03: 0.1 mg via INTRAVENOUS
  Administered 2013-02-03: .8 mg via INTRAVENOUS

## 2013-02-03 MED ORDER — 0.9 % SODIUM CHLORIDE (POUR BTL) OPTIME
TOPICAL | Status: DC | PRN
  Administered 2013-02-03: 1000 mL

## 2013-02-03 MED ORDER — PROPOFOL 10 MG/ML IV BOLUS
INTRAVENOUS | Status: DC | PRN
  Administered 2013-02-03: 40 mg via INTRAVENOUS
  Administered 2013-02-03: 160 mg via INTRAVENOUS

## 2013-02-03 MED ORDER — LIDOCAINE HCL (CARDIAC) 20 MG/ML IV SOLN
INTRAVENOUS | Status: AC
  Filled 2013-02-03: qty 5

## 2013-02-03 MED ORDER — LACTATED RINGERS IV SOLN: INTRAVENOUS | Status: DC

## 2013-02-03 MED ORDER — ONDANSETRON HCL 4 MG/2ML IJ SOLN
INTRAMUSCULAR | Status: DC | PRN
  Administered 2013-02-03: 4 mg via INTRAVENOUS

## 2013-02-03 MED ORDER — FENTANYL CITRATE 0.05 MG/ML IJ SOLN
INTRAMUSCULAR | Status: AC
  Filled 2013-02-03: qty 2

## 2013-02-03 MED ORDER — TEMAZEPAM 15 MG PO CAPS: 15.0000 mg | ORAL_CAPSULE | Freq: Every evening | ORAL | Status: DC | PRN

## 2013-02-03 MED ORDER — DEXAMETHASONE SODIUM PHOSPHATE 10 MG/ML IJ SOLN
INTRAMUSCULAR | Status: AC
  Filled 2013-02-03: qty 1

## 2013-02-03 MED ORDER — ONDANSETRON HCL 4 MG/2ML IJ SOLN
INTRAMUSCULAR | Status: AC
  Filled 2013-02-03: qty 2

## 2013-02-03 MED ORDER — MEPERIDINE HCL 25 MG/ML IJ SOLN: 6.2500 mg | INTRAMUSCULAR | Status: DC | PRN

## 2013-02-03 MED ORDER — FENTANYL CITRATE 0.05 MG/ML IJ SOLN
INTRAMUSCULAR | Status: DC | PRN
  Administered 2013-02-03: 100 ug via INTRAVENOUS
  Administered 2013-02-03 (×5): 50 ug via INTRAVENOUS

## 2013-02-03 MED ORDER — ONDANSETRON HCL 4 MG PO TABS: 4.0000 mg | ORAL_TABLET | Freq: Four times a day (QID) | ORAL | Status: DC | PRN

## 2013-02-03 MED ORDER — LIDOCAINE HCL (CARDIAC) 20 MG/ML IV SOLN
INTRAVENOUS | Status: DC | PRN
  Administered 2013-02-03: 60 mg via INTRAVENOUS

## 2013-02-03 SURGICAL SUPPLY — 39 items
APL SKNCLS STERI-STRIP NONHPOA (GAUZE/BANDAGES/DRESSINGS) ×2
BENZOIN TINCTURE PRP APPL 2/3 (GAUZE/BANDAGES/DRESSINGS) ×1 IMPLANT
CANISTER SUCT 3000ML (MISCELLANEOUS) ×3 IMPLANT
CLOTH BEACON ORANGE TIMEOUT ST (SAFETY) ×3 IMPLANT
DECANTER SPIKE VIAL GLASS SM (MISCELLANEOUS) IMPLANT
DRAPE WARM FLUID 44X44 (DRAPE) ×1 IMPLANT
DRESSING TELFA 8X3 (GAUZE/BANDAGES/DRESSINGS) ×1 IMPLANT
GAUZE SPONGE 4X4 16PLY XRAY LF (GAUZE/BANDAGES/DRESSINGS) ×1 IMPLANT
GLOVE BIO SURGEON STRL SZ 6.5 (GLOVE) ×3 IMPLANT
GOWN PREVENTION PLUS LG XLONG (DISPOSABLE) ×10 IMPLANT
NDL HYPO 25X1 1.5 SAFETY (NEEDLE) IMPLANT
NEEDLE HYPO 25X1 1.5 SAFETY (NEEDLE) IMPLANT
NS IRRIG 1000ML POUR BTL (IV SOLUTION) ×3 IMPLANT
PACK ABDOMINAL GYN (CUSTOM PROCEDURE TRAY) ×3 IMPLANT
PAD ABD 7.5X8 STRL (GAUZE/BANDAGES/DRESSINGS) ×1 IMPLANT
PAD OB MATERNITY 4.3X12.25 (PERSONAL CARE ITEMS) ×3 IMPLANT
PROTECTOR NERVE ULNAR (MISCELLANEOUS) ×2 IMPLANT
SET CYSTO W/LG BORE CLAMP LF (SET/KITS/TRAYS/PACK) IMPLANT
SHEET LAVH (DRAPES) ×1 IMPLANT
SPONGE GAUZE 4X4 12PLY (GAUZE/BANDAGES/DRESSINGS) ×1 IMPLANT
SPONGE LAP 18X18 X RAY DECT (DISPOSABLE) ×5 IMPLANT
STAPLER VISISTAT 35W (STAPLE) ×2 IMPLANT
STRIP CLOSURE SKIN 1/2X4 (GAUZE/BANDAGES/DRESSINGS) ×1 IMPLANT
SUT PLAIN 2 0 XLH (SUTURE) IMPLANT
SUT VIC AB 0 CT1 18XCR BRD8 (SUTURE) ×4 IMPLANT
SUT VIC AB 0 CT1 27 (SUTURE) ×15
SUT VIC AB 0 CT1 27XBRD ANBCTR (SUTURE) ×6 IMPLANT
SUT VIC AB 0 CT1 8-18 (SUTURE) ×6
SUT VIC AB 2-0 CT1 27 (SUTURE) ×6
SUT VIC AB 2-0 CT1 TAPERPNT 27 (SUTURE) ×2 IMPLANT
SUT VIC AB 3-0 PS1 18 (SUTURE)
SUT VIC AB 3-0 PS1 18X BRD (SUTURE) IMPLANT
SUT VIC AB 4-0 PS2 27 (SUTURE) ×1 IMPLANT
SUT VICRYL 0 TIES 12 18 (SUTURE) ×3 IMPLANT
SYR CONTROL 10ML LL (SYRINGE) IMPLANT
TAPE CLOTH SURG 4X10 WHT LF (GAUZE/BANDAGES/DRESSINGS) ×1 IMPLANT
TOWEL OR 17X24 6PK STRL BLUE (TOWEL DISPOSABLE) ×6 IMPLANT
TRAY FOLEY CATH 14FR (SET/KITS/TRAYS/PACK) ×3 IMPLANT
WATER STERILE IRR 1000ML POUR (IV SOLUTION) ×2 IMPLANT

## 2013-02-03 NOTE — Brief Op Note (Signed)
02/03/2013  12:27 PM  PATIENT:  Mikayla Greer  47 y.o. female  PRE-OPERATIVE DIAGNOSIS:  Fibroids, bilateral ovarian cysts  POST-OPERATIVE DIAGNOSIS:  Fibroids, bilateral ovarian cysts  PROCEDURE:  Procedure(s) with comments: HYSTERECTOMY ABDOMINAL (N/A) - with peritoneal washings SALPINGO OOPHORECTOMY (Bilateral)  SURGEON:  Surgeon(s) and Role:    * Melony Overly, MD - Primary    * Bennye Alm, MD - Assisting  PHYSICIAN ASSISTANT:   ASSISTANTS:  Douglass Rivers, MD   ANESTHESIA:   general  EBL:  Total I/O In: 2800 [I.V.:2800] Out: 500 [Urine:250; Blood:250]  BLOOD ADMINISTERED:none  DRAINS: Urinary Catheter (Foley)   LOCAL MEDICATIONS USED:  NONE  SPECIMEN:  Source of Specimen:   Uterus, cervix, bilateral tubes and ovaries, pelvic washings  DISPOSITION OF SPECIMEN:  PATHOLOGY  COUNTS:  YES  TOURNIQUET:  * No tourniquets in log *  DICTATION: .Other Dictation: Dictation Number    PLAN OF CARE: Admit to inpatient   PATIENT DISPOSITION:  PACU - hemodynamically stable.   Delay start of Pharmacological VTE agent (>24hrs) due to surgical blood loss or risk of bleeding: not applicable

## 2013-02-03 NOTE — Progress Notes (Signed)
Update to History and Physical  No marked change in status since office visit. Patient examined. OK to proceed.

## 2013-02-03 NOTE — Anesthesia Procedure Notes (Signed)
Procedure Name: Intubation Date/Time: 02/03/2013 10:04 AM Performed by: Graciela Husbands Pre-anesthesia Checklist: Suction available, Emergency Drugs available, Timeout performed, Patient being monitored and Patient identified Patient Re-evaluated:Patient Re-evaluated prior to inductionOxygen Delivery Method: Circle system utilized Preoxygenation: Pre-oxygenation with 100% oxygen Intubation Type: IV induction Ventilation: Mask ventilation without difficulty Laryngoscope Size: Mac and 3 Grade View: Grade I Tube size: 7.0 mm Number of attempts: 1 Airway Equipment and Method: Stylet Placement Confirmation: ETT inserted through vocal cords under direct vision,  positive ETCO2 and breath sounds checked- equal and bilateral Secured at: 7 cm Tube secured with: Tape Dental Injury: Teeth and Oropharynx as per pre-operative assessment

## 2013-02-03 NOTE — Anesthesia Preprocedure Evaluation (Signed)
Anesthesia Evaluation    Reviewed: Allergy & Precautions, H&P , NPO status , Patient's Chart, lab work & pertinent test results  Airway Mallampati: I TM Distance: >3 FB Neck ROM: full    Dental no notable dental hx. (+) Teeth Intact   Pulmonary neg pulmonary ROS,    Pulmonary exam normal       Cardiovascular negative cardio ROS      Neuro/Psych Anxiety negative neurological ROS     GI/Hepatic negative GI ROS, Neg liver ROS,   Endo/Other  negative endocrine ROS  Renal/GU negative Renal ROS     Musculoskeletal negative musculoskeletal ROS (+)   Abdominal Normal abdominal exam  (+)   Peds  Hematology negative hematology ROS (+)   Anesthesia Other Findings   Reproductive/Obstetrics negative OB ROS                           Anesthesia Physical Anesthesia Plan  ASA: II  Anesthesia Plan: General   Post-op Pain Management:    Induction: Intravenous  Airway Management Planned: Oral ETT  Additional Equipment:   Intra-op Plan:   Post-operative Plan: Extubation in OR  Informed Consent: I have reviewed the patients History and Physical, chart, labs and discussed the procedure including the risks, benefits and alternatives for the proposed anesthesia with the patient or authorized representative who has indicated his/her understanding and acceptance.   Dental Advisory Given  Plan Discussed with: CRNA and Surgeon  Anesthesia Plan Comments:         Anesthesia Quick Evaluation

## 2013-02-03 NOTE — Progress Notes (Signed)
Day of Surgery Procedure(s) (LRB): HYSTERECTOMY ABDOMINAL (N/A) SALPINGO OOPHORECTOMY (Bilateral)  Subjective: Patient reports incisional pain.    Objective: I have reviewed patient's vital signs and intake and output.  General: sleepy but arousable. Resp: clear to auscultation bilaterally Cardio: regular rate and rhythm, S1, S2 normal, no murmur, click, rub or gallop GI: incision:  covered by dressing. and scant bowel sounds, soft, nontender. Extremities:  PAs and ted hose on.  DPs 2+ bilaterally.   Vaginal Bleeding: none  Assessment: s/p Procedure(s) with comments: HYSTERECTOMY ABDOMINAL (N/A) - with peritoneal washings SALPINGO OOPHORECTOMY (Bilateral): stable  Plan: Clear liquids  foley overnight. CBC and BMP in am.  Incentive spirometry.  LOS: 0 days    Mikayla Greer de Gwenevere Ghazi 02/03/2013, 5:38 PM

## 2013-02-03 NOTE — Anesthesia Postprocedure Evaluation (Signed)
Anesthesia Post Note  Patient: Mikayla Greer  Procedure(s) Performed: Procedure(s) (LRB): HYSTERECTOMY ABDOMINAL (N/A) SALPINGO OOPHORECTOMY (Bilateral)  Anesthesia type: General  Patient location: PACU  Post pain: Pain level controlled  Post assessment: Post-op Vital signs reviewed  Last Vitals:  Filed Vitals:   02/03/13 1230  BP: 132/68  Pulse: 76  Temp: 36.8 C  Resp: 14    Post vital signs: Reviewed  Level of consciousness: sedated  Complications: No apparent anesthesia complications

## 2013-02-03 NOTE — Transfer of Care (Signed)
Immediate Anesthesia Transfer of Care Note  Patient: Mikayla Greer  Procedure(s) Performed: Procedure(s) with comments: HYSTERECTOMY ABDOMINAL (N/A) - with peritoneal washings SALPINGO OOPHORECTOMY (Bilateral)  Patient Location: PACU  Anesthesia Type:General  Level of Consciousness: awake, alert  and oriented  Airway & Oxygen Therapy: Patient Spontanous Breathing and Patient connected to nasal cannula oxygen  Post-op Assessment: Report given to PACU RN and Post -op Vital signs reviewed and stable  Post vital signs: Reviewed and stable  Complications: No apparent anesthesia complications

## 2013-02-03 NOTE — Anesthesia Postprocedure Evaluation (Signed)
Anesthesia Post Note  Patient: Mikayla Greer  Procedure(s) Performed: Procedure(s) (LRB): HYSTERECTOMY ABDOMINAL (N/A) SALPINGO OOPHORECTOMY (Bilateral)  Anesthesia type: General  Patient location: Women's Unit  Post pain: Pain level controlled  Post assessment: Post-op Vital signs reviewed  Last Vitals:  Filed Vitals:   02/03/13 1809  BP: 135/62  Pulse: 95  Temp: 36.9 C  Resp: 12    Post vital signs: Reviewed  Level of consciousness: sedated  Complications: No apparent anesthesia complications

## 2013-02-04 ENCOUNTER — Encounter (HOSPITAL_COMMUNITY): Payer: Self-pay | Admitting: Obstetrics and Gynecology

## 2013-02-04 LAB — BASIC METABOLIC PANEL
BUN: 8 mg/dL (ref 6–23)
CO2: 29 mEq/L (ref 19–32)
Calcium: 9 mg/dL (ref 8.4–10.5)
Chloride: 102 mEq/L (ref 96–112)
Creatinine, Ser: 0.58 mg/dL (ref 0.50–1.10)
GFR calc Af Amer: >90 mL/min (ref >90)
Sodium: 139 mEq/L (ref 135–145)

## 2013-02-04 LAB — CBC
HCT: 32.3 % — ABNORMAL LOW (ref 36.0–46.0)
MCHC: 32.5 g/dL (ref 30.0–36.0)
MCV: 86.8 fL (ref 78.0–100.0)
Platelets: 251 10*3/uL (ref 150–400)
RBC: 3.72 MIL/uL — ABNORMAL LOW (ref 3.87–5.11)
RDW: 17.7 % — ABNORMAL HIGH (ref 11.5–15.5)

## 2013-02-04 MED FILL — Heparin Sodium (Porcine) Inj 5000 Unit/ML: INTRAMUSCULAR | Qty: 1 | Status: AC

## 2013-02-04 NOTE — Op Note (Signed)
Mikayla Greer, Mikayla Greer          ACCOUNT NO.:  [id number]  MEDICAL RECORD NO.:  [id number]  LOCATION:  9316                          FACILITY:  [facility]  PHYSICIAN:  Randye Lobo, M.D.   DATE OF BIRTH:  Aug 31, 1965  DATE OF PROCEDURE:  02/03/2013 DATE OF DISCHARGE:                              OPERATIVE REPORT   PREOPERATIVE DIAGNOSES: 1. Symptomatic uterine fibroids. 2. Bilateral ovarian cysts.  POSTOPERATIVE DIAGNOSES: 1. Symptomatic uterine fibroids. 2. Bilateral ovarian cysts.  PROCEDURE:  Total abdominal hysterectomy with bilateral salpingo- oophorectomy, collection of pelvic washings.  SURGEON:  Randye Lobo, MD  ASSISTANT:  Ivor Costa. Lathrop, MD  ANESTHESIA:  General endotracheal.  IV FLUIDS:  2800 mL Ringer's lactate.  EBL:  250 mL.  URINE OUTPUT:  250 mL.  COMPLICATIONS:  None.  INDICATIONS FOR THE PROCEDURE:  The patient is a 47 year old, gravida 1, para 1-0-0-1, Caucasian female who presented with heavy menstrual bleeding and a history of hysteroscopic myomectomy.  Pelvic ultrasound documented a multifibroid uterus with the largest fibroid being 4.6 cm and posterior and distorting the endometrial cavity.  The endometrial stripe measured 10.73 mm.  There was a possibility of a 15-mm endometrial mass.  The left ovary was multicystic measuring 4.8 cm. There were calcifications and low-level echoes consistent with a possible dermoid cyst.  The right ovary had a thick-walled cyst measuring 3.6 cm.  Each of the ovarian cysts were avascular.  An attempt was made to perform an endometrial biopsy, but this was not possible due to distortion of the endometrial cavity from the posterior lower uterine segment fibroid.  The patient declined any future childbearing and a plan was made to proceed with a total abdominal hysterectomy with bilateral salpingo-oophorectomy and pelvic washings after risks, benefits, and alternatives were reviewed.  FINDINGS:  Examination  under anesthesia revealed a 14-week size uterus. The ovaries were not palpated separately from the uterus.  At the time of laparotomy, the patient was noted to have a 14-15 week size uterus.  The left ovary contained a 3.5-cm multicystic region.  The right ovary contained a 1.5-cm corpus luteum cyst.  The fallopian tubes were unremarkable.  SPECIMENS:  Pelvic washings were sent to Pathology separately from the uterus, bilateral tubes and ovaries, and cervix.  DESCRIPTION OF PROCEDURE:  The patient was reidentified in the preoperative hold area.  She received cefotetan 2 g IV.  She received PAS stockings and TED hose for DVT prophylaxis.  The patient was escorted to the operating room.  She was placed on the operating room table in the supine position.  General endotracheal anesthesia was induced and then she was placed in the dorsal lithotomy position with Allen stirrups.  The abdomen and vagina were sterilely prepped and she was then sterilely draped.  A Foley catheter was placed inside the bladder and an exam under anesthesia was performed.  Attention was turned to the abdomen where a Pfannenstiel incision was created sharply with a scalpel.  Dissection was carried through the subcutaneous layer using monopolar cautery to create hemostasis.  The fascia was then incised in the midline with a scalpel and the incision was extended bilaterally with the Mayo scissors.  The rectus muscles were divided  in the midline.  The parietal peritoneum was elevated with 2 hemostat clamps and was entered sharply with the Metzenbaum scissors. The peritoneal incision was extended cranially and caudally.  At this time, pelvic washings were obtained and sent to Pathology.  An exploration of the upper abdomen and the pelvis was performed.  Long Kelly clamps were placed across the adnexal structures, and the round ligaments bilaterally.  The left round ligament was isolated at this time and was  suture ligated with a transfixing suture of 0 Vicryl. The round ligament was then divided with monopolar cautery.  The broad ligament was opened sharply anteriorly and posteriorly with a Metzenbaum scissors.  The bladder flap was taken down on the patient's left hand side.  The ureter was identified along the left pelvic sidewall.  A window was then created through the posterior leaf of the broad ligament.  The infundibulopelvic ligament was doubly clamped and sharply divided.  This pedicle was free tied with a suture of 0 Vicryl followed by a suture ligature the same.  The same procedure that was performed on the patient's left hand side was then repeated on the right hand side.  The right ureter was carefully identified on this side as well.  At this point, a retractor was placed inside the pelvis.  An Lenox Ahr retractor was placed and the bowel was packed into the upper abdomen.  The bladder flap was further created using sharp and blunt dissection.  Each of the uterine arteries were then skeletonized bilaterally.  The uterine arteries were then doubly clamped, sharply divided, and sutured with 0 Vicryl.  A second clamp was placed across the uterine arteries bilaterally.  These pedicles were sharply divided and the pedicles were then suture ligated with 0 Vicryl, a second time the uterine arteries.  At this point, the uterine fundus was amputated from the cervix to allow for better visualization for the remainder of the hysterectomy procedure.  This was performed with monopolar cautery.  The specimen was set aside and sent later to pathology with the cervix.  Sharp dissection was used to dissect any remaining bladder off of the cervix.  The uterosacral ligaments were clamped with curved Haney clamps and sharply divided.  Entry into the vagina was possible at this point. Each of these pedicles were suture ligated with transfixing sutures of 0 Vicryl.  The cervix was  circumscribed from the vaginal apex using a Jorgenson scissors.  The cervix was then sent to Pathology with the remaining specimen.  The vaginal cuff was closed with running locked sutures of 0 Vicryl.  The right uterosacral ligament was noted to be loose at this time and that was re-tied with a transfixing suture of 0 Vicryl which then provided excellent hemostasis.  The pelvis was irrigated at this time and suctioned, and all of the operative sites were re-examined.  There was a slight bit of oozing noted along the uterine artery pedicle along the patient's right hand side.  This was cauterized very superficially and good hemostasis resulted.  Each of the ureters were examined along the pelvic sidewalls and each were noted to peristalse freely.  All pedicles were hemostatic and the abdomen was therefore closed at this time.  The self-retaining retractor and the lap pads were removed from within the peritoneal cavity.  The parietal peritoneum was closed with a running suture of 2-0 Vicryl. The rectus muscles were reapproximated in the midline with interrupted sutures of 2-0 Vicryl.  The fascia was closed with  a running suture of 0 Vicryl.  The subcutaneous layer was irrigated, suctioned, and made hemostatic with monopolar cautery.  Interrupted sutures of 3-0 plain were placed in this layer.  Hemostasis was good at this time.  The skin was closed with a subcuticular suture of 4-0 Vicryl.  There was some slight oozing from the skin at this time.  A pressure dressing was placed over the incision after Steri-Strips and benzoin were placed.  The patient was awakened, extubated, and escorted to the recovery room in stable condition.  There were no complications to the procedure.  All needle, instrument, sponge counts were correct.     Randye Lobo, M.D.     BES/MEDQ  D:  02/03/2013  T:  02/04/2013  Job:  [id number]

## 2013-02-04 NOTE — Progress Notes (Signed)
1 Day Post-Op Procedure(s) (LRB): HYSTERECTOMY ABDOMINAL (N/A) SALPINGO OOPHORECTOMY (Bilateral)  Subjective: Patient reports tolerating Spite.  Ambulated.  Foley out.  Voided well.  Objective: I have reviewed patient's vital signs, intake and output and labs.  General: alert Resp: clear to auscultation bilaterally Cardio: regular rate and rhythm, S1, S2 normal, no murmur, click, rub or gallop GI: soft, non-tender; bowel sounds normal; no masses,  no organomegaly and incision:  Dressing clean, dry and intact.  Vaginal Bleeding: minimal  Assessment: s/p Procedure(s) with comments: HYSTERECTOMY ABDOMINAL (N/A) - with peritoneal washings SALPINGO OOPHORECTOMY (Bilateral): stable  Plan: Advance diet Encourage ambulation Advance to PO medication  LOS: 1 day    Amundson de Gwenevere Ghazi 02/04/2013, 8:26 AM

## 2013-02-04 NOTE — Progress Notes (Signed)
Progress Note  Subjective  Tolerating regular diet.  Taking only Motrin so far since PCA D/C'd.  Objective  Vitals reviewed.  Abdomen - incision - mild amount of bloody drainage noted.  Incision intact with subQ sutures.  No hematoma or induration.   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  Assessment  Doing well post op.  Plan  Surgical procedure findings and pathology report discussed with patient.  Questions answered.  D/C home tomorrow. Instructions and precautions already reviewed.

## 2013-02-05 ENCOUNTER — Other Ambulatory Visit: Payer: Self-pay

## 2013-02-05 MED ORDER — OXYCODONE-ACETAMINOPHEN 5-325 MG PO TABS: 1.0000 | ORAL_TABLET | ORAL | Status: DC | PRN

## 2013-02-05 MED ORDER — IBUPROFEN 600 MG PO TABS: 600.0000 mg | ORAL_TABLET | Freq: Four times a day (QID) | ORAL | Status: DC | PRN

## 2013-02-05 NOTE — Progress Notes (Signed)
Gyn post op 2 Days Post-Op Procedure(s) (LRB): HYSTERECTOMY ABDOMINAL (N/A) SALPINGO OOPHORECTOMY (Bilateral)  Subjective: Patient reports + flatus and no problems voiding.    Objective: I have reviewed patient's vital signs and intake and output.  General: alert GI: soft, non-tender; bowel sounds normal; no masses,  no organomegaly and incision: clean, dry and intact Steristrips on.   Assessment: s/p Procedure(s) with comments: HYSTERECTOMY ABDOMINAL (N/A) - with peritoneal washings SALPINGO OOPHORECTOMY (Bilateral): progressing well  Plan: Discharge home Instructions reviewed. Follow up in 4 days. Instructions and precautions given.   LOS: 2 days    Amundson de Gwenevere Ghazi 02/05/2013, 6:41 AM

## 2013-02-05 NOTE — Progress Notes (Signed)
Pt  Ambulated out   Teaching complete    

## 2013-02-07 NOTE — Discharge Summary (Signed)
Physician Discharge Summary  Patient ID: Mikayla Greer MRN: [id number] DOB/AGE: May 04, 1965 47 y.o.  Admit date: 02/03/2013 Discharge date: 02/05/13  Admission Diagnoses:  Symptomatic uterine fibroid, bilateral ovarian cysts.  Discharge Diagnoses:  1.  Status post total abdominal hysterectomy with bilateral salpingo-oophorectomy and collection of pelvic washings. 2.  Uterine leiomyomata 3.  Adenomyosis. 4.  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 Active Problems:   * No active hospital problems. *   Discharged Condition: good  Hospital Course:  The patient was admitted on 02/03/13 following a total abdominal hysterectomy and collection of pelvic washings for symptomatic uterine fibroids and bilateral ovarian cysts.  The patient was experiencing menorrhagia and anemia.  By ultrasound she had a complex left ovarian mass and endometrial thickening.  A large posterior uterine fibroid precluded successful endometrial biopsy prior to surgical care.  The patient's surgery was uncomplicated and she had a total specimen weight of 630 grams.  Postoperatively, the patient had a benign surgical recovery.  Her pain was well controlled with A morphine PCA and toradol IV.  By post op day number one, she was converted to oral Percocet and Motrin when she began taking po well.  She tolerated a regular diet prior to discharge.  Her foley catheter was removed on post op day one, and she voided spontaneously.  She was able to ambulate independently prior to discharge and she received Ted hose and PAS stockings for DVT prophylaxis.  The patient demonstrated stable vital signs and no significant fevers.  Her abdominal incision had some minor bleeding noted on her dressing when it was removed on post op day number one.  She had very minimal vaginal bleeding.  Her post op day number one hematocrit was 10.5.  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   She was found to be in good condition and ready for discharge on post op day number two.   Consults: None  Significant Diagnostic Studies: labs:  As noted above int the Hospital Course.   Treatments: surgery:  Total abdominal hysterectomy with bilateral salpingo-oophorectomy and collection of pelvic washings  Discharge Exam: Blood pressure 128/76, pulse 75, temperature 99.4 F (37.4 C), temperature source Oral, resp. rate 16, height 5\' 5"  (1.651 m), weight 156 lb (70.761 kg), SpO2 96.00%. General appearance: alert and cooperative GI: soft, non-tender; bowel sounds normal; no masses,  no organomegaly and  Incision intact, subcutaneous sutures with steristrips in place.  Disposition: 01-Home or Self Care   Future Appointments Provider Department Dept Phone   02/09/2013 2:30 PM Mikayla Overly, MD [facility] [phone]   03/13/2013 7:30 AM Mikayla Overly, MD [facility] [phone]   10/19/2013 9:45 AM Mikayla Overly, MD [facility] [phone]       Medication List         ALPRAZolam 0.5 MG tablet  Commonly known as:  XANAX  Take 1 tablet by mouth as needed.     CALCIUM 1200+D3 PO  Take 1 tablet by mouth daily.     escitalopram 20 MG tablet  Commonly known as:  LEXAPRO  Take 1 tablet by mouth daily.     ESTROVEN ENERGY PO  Take 1 capsule by mouth 2 (two) times daily.     ferrous sulfate 325 (65 FE) MG tablet  Take 325 mg  by mouth at bedtime.     ibuprofen 600 MG tablet  Commonly known as:  ADVIL,MOTRIN  Take 1 tablet (600 mg total) by mouth every 6 (six) hours as needed (mild pain).     oxyCODONE-acetaminophen 5-325 MG per tablet  Commonly known as:  PERCOCET/ROXICET  Take 1-2 tablets by mouth every 4 (four) hours as needed for severe pain (moderate to severe pain (when tolerating fluids)).       Post op instructions, limitations, and warning signs reviewed and printed for the patient in post hysterectomy care.     Follow-up Information   Follow up with Mikayla de Gwenevere Ghazi, MD. (as scheduled)    Specialty:  Obstetrics and Gynecology   Contact information:   [address] [place] [address] [phone]       Signed: Annamaria Helling 02/07/2013, 6:43 PM

## 2013-02-09 ENCOUNTER — Ambulatory Visit: Payer: Self-pay | Admitting: Obstetrics and Gynecology

## 2013-02-11 ENCOUNTER — Ambulatory Visit (INDEPENDENT_AMBULATORY_CARE_PROVIDER_SITE_OTHER): Payer: BC Managed Care – PPO | Admitting: Obstetrics and Gynecology

## 2013-02-11 ENCOUNTER — Encounter: Payer: Self-pay | Admitting: Obstetrics and Gynecology

## 2013-02-11 VITALS — BP 110/80 | HR 66 | Ht 65.0 in | Wt 154.0 lb

## 2013-02-11 DIAGNOSIS — Z9889 Other specified postprocedural states: Secondary | ICD-10-CM

## 2013-02-11 MED ORDER — ESTRADIOL 0.5 MG PO TABS: 0.5000 mg | ORAL_TABLET | Freq: Every day | ORAL | Status: DC

## 2013-02-11 MED ORDER — OXYCODONE-ACETAMINOPHEN 5-325 MG PO TABS: 1.0000 | ORAL_TABLET | ORAL | Status: DC | PRN

## 2013-02-11 MED ORDER — IBUPROFEN 600 MG PO TABS: 600.0000 mg | ORAL_TABLET | Freq: Four times a day (QID) | ORAL | Status: DC | PRN

## 2013-02-11 NOTE — Patient Instructions (Signed)
Continue decreased activity for a total of 6 weeks post op.

## 2013-02-11 NOTE — Progress Notes (Signed)
Patient ID: Mikayla Greer, female   DOB: Sep 10, 1965, 47 y.o.   MRN: [id number]  Subjective  Here for post op visit.  No significant hot flashes.  If anything, feels cold. Stopped iron due to constipation.  Bowel function is now normal. Voiding well. No vaginal bleeding.  Running out of Percocet and ibuprofen.   Objective  BP  110/80    P 66 Abdomen - Pfannenstiel incision - intact, clean, and dry. Steristrips on.   Pathology - fibroids. Left Brenner tumor and benign serous cystadenoma.  Assessment  Status post TAH/BSO.  Doing well.  Surgical menopause.  Plan  Continue decreased activity.  Refill of Percocet #15.  See EPIC. Refill of Motrin #30.  See EPIC. I discussed risks and benefits of ERT.  Patient will start Estrace 0.5 mg daily.  #30 to local pharmacy.  #90 with 3 refills to [facility]. Follow up in 4 weeks.

## 2013-03-13 ENCOUNTER — Encounter: Payer: Self-pay | Admitting: Obstetrics and Gynecology

## 2013-03-13 ENCOUNTER — Ambulatory Visit (INDEPENDENT_AMBULATORY_CARE_PROVIDER_SITE_OTHER): Payer: BC Managed Care – PPO | Admitting: Obstetrics and Gynecology

## 2013-03-13 VITALS — BP 110/74 | HR 80 | Resp 18 | Ht 65.0 in | Wt 152.5 lb

## 2013-03-13 DIAGNOSIS — Z9889 Other specified postprocedural states: Secondary | ICD-10-CM

## 2013-03-13 NOTE — Patient Instructions (Signed)
Call if you experience vaginal dryness or discomfort with intercourse. I will then prescribe vaginal estrogen treatment.

## 2013-03-13 NOTE — Progress Notes (Signed)
Patient ID: Mikayla Greer, female   DOB: 10-Oct-1965, 47 y.o.   MRN: [id number]  Subjective  Here for post op check Status post TAH/BSO 02/03/13.  States she is doing well.  On Estrace 0.5 mg daily.  No night sweats.  Occasional hot flash.  Good sleep quality.  No drainage vaginally. Good bladder and bowel function.   Saw Dr. Sharl Ma, endocrinology. Will have a 6 month recheck with blood work and repeat ultrasound.  Has a benign thyroid nodules.   Objective   BP 110/74    P 80  Abdomen - Pfannenstiel incision intact.  Knot at left apex - clipped with scissors.  Pelvic - normal external genitalia and urethra. Cervix absent. Vagina - no suture noted.   No masses and nontender.  Assessment  Status post TAH/BSO - fibroids, benign Brenner tumor of ovary. ERT patient. Right thyroid nodule.  Status post benign biopsy.   Plan  Return to normal activity gradually. May resume sexual activity in one week. Continue with Estrace.  Call for vaginal dryness or discomfort with intercourse.  Will then prescribe vaginal estrogen cream or Vagifem. Follow up for annual examination in July 2015. Follow up with endocrinology as scheduled.   After visit summary to patient.

## 2013-03-18 ENCOUNTER — Encounter: Payer: Self-pay | Admitting: Gynecology

## 2013-03-18 ENCOUNTER — Telehealth: Payer: Self-pay | Admitting: Obstetrics and Gynecology

## 2013-03-18 ENCOUNTER — Ambulatory Visit (INDEPENDENT_AMBULATORY_CARE_PROVIDER_SITE_OTHER): Payer: BC Managed Care – PPO | Admitting: Gynecology

## 2013-03-18 VITALS — BP 116/76 | Temp 98.7°F | Resp 12 | Ht 65.0 in | Wt 153.0 lb

## 2013-03-18 DIAGNOSIS — L0291 Cutaneous abscess, unspecified: Secondary | ICD-10-CM

## 2013-03-18 DIAGNOSIS — L0390 Cellulitis, unspecified: Secondary | ICD-10-CM

## 2013-03-18 MED ORDER — CEPHALEXIN 500 MG PO CAPS: 500.0000 mg | ORAL_CAPSULE | Freq: Four times a day (QID) | ORAL | Status: DC

## 2013-03-18 NOTE — Telephone Encounter (Signed)
Pt had surgery 6 weeks ago and her incision is bright red and hurting. Pt would like to talk with the nurse not sure if she needs to come in to be seen.

## 2013-03-18 NOTE — Progress Notes (Signed)
Subjective:     Patient ID: Mikayla Greer, female   DOB: May 06, 1965, 47 y.o.   MRN: [id number]  HPI Comments: Pt comes in complaining of redness in her incision.  Pt was just seen for post-op last week and was without any issues.  Pt denies fever or chills.  She reports both  her jeans and underwear cut above this area.  She denies either fever, chills or drainage from the area. No other complaints    Review of Systems  Constitutional: Negative for fever, chills and fatigue.       Objective:   Physical Exam  Constitutional: She is oriented to person, place, and time. She appears well-developed and well-nourished.  Abdominal: Soft. Normal appearance.    Neurological: She is alert and oriented to person, place, and time.       Assessment:     cellulitis     Plan:     Apply warm compress Keflex Will place pad in underwear to decrease local contact F/u 1w

## 2013-03-18 NOTE — Patient Instructions (Signed)
Use heat 3x/d Protect the area from contact irritation Keflex for 1w F/u 1w

## 2013-03-18 NOTE — Telephone Encounter (Signed)
Spoke with patient. States pain/redness started " a couple of days ago" at incision site on abdomen. Unsure if it is from clothes irritation  But is painful with swelling/pain and "a dark red bubble". Advised OV needed for Evaluation. Patient is agreeable. Dr. Edward Jolly not in office, appointment made with Dr. Farrel Gobble for today at 1430, Patient agreeable.

## 2013-03-18 NOTE — Telephone Encounter (Signed)
Message left to return call to [place] at [phone] on work and mobile number.   Needs triage/MD Appt.

## 2013-03-24 ENCOUNTER — Ambulatory Visit (INDEPENDENT_AMBULATORY_CARE_PROVIDER_SITE_OTHER): Payer: BC Managed Care – PPO | Admitting: Gynecology

## 2013-03-24 VITALS — BP 128/80 | HR 82 | Resp 16 | Ht 65.0 in | Wt 154.0 lb

## 2013-03-24 DIAGNOSIS — L0291 Cutaneous abscess, unspecified: Secondary | ICD-10-CM

## 2013-03-24 DIAGNOSIS — L0390 Cellulitis, unspecified: Secondary | ICD-10-CM

## 2013-03-24 NOTE — Progress Notes (Signed)
Subjective:     Patient ID: Mikayla Greer, female   DOB: [birth year], 47 y.o.   MRN: [id number]  HPI Comments: Pt here for 1w f/u of incisional cellulitis.  Pt is taking antibiotics without issues.  She reports having a slight clear discharge from the area of concern, no fever or chills and overall feels well.    Review of Systems  Constitutional: Negative for fever, chills and fatigue.       Objective:   Physical Exam  Constitutional: She is oriented to person, place, and time. She appears well-developed and well-nourished.  Abdominal: Soft. Normal appearance.    Neurological: She is alert and oriented to person, place, and time.       Assessment:     incisional cellulitis resolved    Plan:     Will complete antibioticss F/u prn

## 2013-04-15 ENCOUNTER — Ambulatory Visit (INDEPENDENT_AMBULATORY_CARE_PROVIDER_SITE_OTHER): Payer: BC Managed Care – PPO | Admitting: Obstetrics and Gynecology

## 2013-04-15 ENCOUNTER — Telehealth: Payer: Self-pay | Admitting: Obstetrics and Gynecology

## 2013-04-15 ENCOUNTER — Encounter: Payer: Self-pay | Admitting: Obstetrics and Gynecology

## 2013-04-15 VITALS — BP 100/64 | HR 72 | Temp 98.9°F | Ht 65.0 in | Wt 156.0 lb

## 2013-04-15 DIAGNOSIS — T8140XA Infection following a procedure, unspecified, initial encounter: Secondary | ICD-10-CM

## 2013-04-15 DIAGNOSIS — T8149XA Infection following a procedure, other surgical site, initial encounter: Secondary | ICD-10-CM

## 2013-04-15 MED ORDER — SULFAMETHOXAZOLE-TRIMETHOPRIM 800-160 MG PO TABS
ORAL_TABLET | ORAL | Status: DC
Start: 2013-04-15 — End: 2013-05-07

## 2013-04-15 NOTE — Telephone Encounter (Signed)
Patient calling wanting to see Dr. Quincy Simmonds today about a post op "infected incision." She has been seen for this once already she stated.

## 2013-04-15 NOTE — Telephone Encounter (Signed)
LMTCB

## 2013-04-15 NOTE — Telephone Encounter (Signed)
Patient needs a 1 week reck with Dr.Silva, no appointment available.

## 2013-04-15 NOTE — Progress Notes (Signed)
Patient ID: Mikayla Greer, female   DOB: May 17, 1965, 48 y.o.   MRN: [id number]  Subjective  Patient is here for an incision check.  Status post TAH/BSO 02/03/13. Seen on March 18, 2013 for redness and swelling of the incision. Something popped and had a little bit of bloody discharge at that time only.  Treated for cellulitis with Keflex and improved.  Looks like a boil is coming to the surface.  Taking estrogen orally.  No new exposures.  No sores on skin anywhere else.   No fever, shakes, chills.  Objective  Incision - small area of tender erythema right side of wound, measures 1 cm.  No real mass.  Looks violaceous and like potential blood under skin or just scarring.  Tender.  Nonfluctuant.    Assessment  Incisional pain.  MRSA? Stitch reaction? Wound endometriosis?  Plan  Will treat with Bactrim DS. Will do recheck in one week. If no improvement, will likely open the wound under local anesthesia in office to assess.    10 minutes face to face time of which over 50% was spent in counseling.

## 2013-04-15 NOTE — Telephone Encounter (Signed)
Dr Quincy Simmonds,  Would it be ok to see this patient for her follow-up on Mon 04-20-13?

## 2013-04-15 NOTE — Telephone Encounter (Signed)
Spoke with patient. States that she is having the same pain as prior with redness and irritation. Not taking temperatures. Called patient and appointment made.

## 2013-04-15 NOTE — Telephone Encounter (Signed)
Yes

## 2013-04-15 NOTE — Patient Instructions (Signed)
Sulfamethoxazole; Trimethoprim, SMX-TMP tablets What is this medicine? SULFAMETHOXAZOLE; TRIMETHOPRIM or SMX-TMP (suhl fuh meth OK suh zohl; trye METH oh prim) is a combination of a sulfonamide antibiotic and a second antibiotic, trimethoprim. It is used to treat or prevent certain kinds of bacterial infections. It will not work for colds, flu, or other viral infections. This medicine may be used for other purposes; ask your health care provider or pharmacist if you have questions. COMMON BRAND NAME(S): Bacter-Aid DS , Bactrim DS, Bactrim, Septra DS, Septra What should I tell my health care provider before I take this medicine? They need to know if you have any of these conditions: -anemia -asthma -being treated with anticonvulsants -if you frequently drink alcohol containing drinks -kidney disease -liver disease -low level of folic acid or OACZYSA-6-[id number] dehydrogenase -poor nutrition or malabsorption -porphyria -severe allergies -thyroid disorder -an unusual or allergic reaction to sulfamethoxazole, trimethoprim, sulfa drugs, other medicines, foods, dyes, or preservatives -pregnant or trying to get pregnant -breast-feeding How should I use this medicine? Take this medicine by mouth with a full glass of water. Follow the directions on the prescription label. Take your medicine at regular intervals. Do not take it more often than directed. Do not skip doses or stop your medicine early. Talk to your pediatrician regarding the use of this medicine in children. Special care may be needed. This medicine has been used in children as young as 93 months of age. Overdosage: If you think you have taken too much of this medicine contact a poison control center or emergency room at once. NOTE: This medicine is only for you. Do not share this medicine with others. What if I miss a dose? If you miss a dose, take it as soon as you can. If it is almost time for your next dose, take only that dose. Do  not take double or extra doses. What may interact with this medicine? Do not take this medicine with any of the following medications: -aminobenzoate potassium -dofetilide -metronidazole This medicine may also interact with the following medications: -ACE inhibitors like benazepril, enalapril, lisinopril, and ramipril -cyclosporine -digoxin -diuretics -indomethacin -medicines for diabetes -methenamine -methotrexate -phenytoin -potassium supplements -pyrimethamine -sulfinpyrazone -tricyclic antidepressants -warfarin This list may not describe all possible interactions. Give your health care provider a list of all the medicines, herbs, non-prescription drugs, or dietary supplements you use. Also tell them if you smoke, drink alcohol, or use illegal drugs. Some items may interact with your medicine. What should I watch for while using this medicine? Tell your doctor or health care professional if your symptoms do not improve. Drink several glasses of water a day to reduce the risk of kidney problems. Do not treat diarrhea with over the counter products. Contact your doctor if you have diarrhea that lasts more than 2 days or if it is severe and watery. This medicine can make you more sensitive to the sun. Keep out of the sun. If you cannot avoid being in the sun, wear protective clothing and use a sunscreen. Do not use sun lamps or tanning beds/booths. What side effects may I notice from receiving this medicine? Side effects that you should report to your doctor or health care professional as soon as possible: -allergic reactions like skin rash or hives, swelling of the face, lips, or tongue -breathing problems -fever or chills, sore throat -irregular heartbeat, chest pain -joint or muscle pain -pain or difficulty passing urine -red pinpoint spots on skin -redness, blistering, peeling or loosening of the skin,  health care professional as soon as possible:  -allergic reactions like skin rash or hives, swelling of the face, lips, or tongue  -breathing problems  -fever or chills, sore throat  -irregular heartbeat, chest pain  -joint or muscle pain  -pain or difficulty passing urine  -red pinpoint spots on skin  -redness, blistering, peeling or loosening of the skin, including inside the mouth  -unusual bleeding or bruising  -unusually weak  or tired  -yellowing of the eyes or skin  Side effects that usually do not require medical attention (report to your doctor or health care professional if they continue or are bothersome):  -diarrhea  -dizziness  -headache  -loss of appetite  -nausea, vomiting  -nervousness  This list may not describe all possible side effects. Call your doctor for medical advice about side effects. You may report side effects to FDA at 1-800-FDA-1088.  Where should I keep my medicine?  Keep out of the reach of children.  Store at room temperature between 20 to 25 degrees C (68 to 77 degrees F). Protect from light. Throw away any unused medicine after the expiration date.  NOTE: This sheet is a summary. It may not cover all possible information. If you have questions about this medicine, talk to your doctor, pharmacist, or health care provider.  © 2014, [employer]. (2007-11-19 11:32:51)

## 2013-04-16 NOTE — Telephone Encounter (Signed)
Spoke with patient and scheduled her for 04/20/13 at 1:00 PM with Dr. Quincy Simmonds.

## 2013-04-20 ENCOUNTER — Encounter: Payer: Self-pay | Admitting: Obstetrics and Gynecology

## 2013-04-20 ENCOUNTER — Ambulatory Visit (INDEPENDENT_AMBULATORY_CARE_PROVIDER_SITE_OTHER): Payer: BC Managed Care – PPO | Admitting: Obstetrics and Gynecology

## 2013-04-20 VITALS — BP 120/80 | HR 86 | Resp 20 | Ht 65.0 in | Wt 155.8 lb

## 2013-04-20 DIAGNOSIS — T8140XA Infection following a procedure, unspecified, initial encounter: Secondary | ICD-10-CM

## 2013-04-20 DIAGNOSIS — T8149XA Infection following a procedure, other surgical site, initial encounter: Secondary | ICD-10-CM

## 2013-04-20 NOTE — Patient Instructions (Signed)
Please call if you develop another wound site that starts draining. I would like to do a culture of the area to see if it is MRSA infection.

## 2013-04-20 NOTE — Progress Notes (Signed)
Patient ID: Mikayla Greer, female   DOB: March 12, 1966, 48 y.o.   MRN: [id number]  Subjective  Feels better. Area popped and then incision drained.  On Bactrim DS and has four more doses.  Objective  BP  120/80   P 86  Incision - intact, nontender, no erythema, no draining area.  Assessment  Recurrent wound infection - superficial.  I suspect the patient may have had MRSA or a stitch abscess.  Plan  Finish Bactrim DS. Return for any further draining areas of the wound.   Would do a wound culture then.   10 minutes face to face time of which over 50% was spent in counseling.

## 2013-04-22 NOTE — Telephone Encounter (Signed)
See next OV notes and path report.  Routed to provider for signature, encounter closed.

## 2013-05-06 ENCOUNTER — Telehealth: Payer: Self-pay | Admitting: Obstetrics and Gynecology

## 2013-05-06 NOTE — Telephone Encounter (Signed)
I would like to see the patient tomorrow for evaluation.

## 2013-05-06 NOTE — Telephone Encounter (Signed)
Patient thinks she has an infection on her incision.

## 2013-05-06 NOTE — Telephone Encounter (Signed)
Return call to patient.  She reports that the "same infection has returned."  Has noticed return of redness, swelling and soreness to incision over last 1-2 days and then today has developed "purple bubble on the end". Denies fever. Reports that Dr Quincy Simmonds wanted to culture this area.  It has not ruptured.  Wanted to see if Dr Quincy Simmonds wants to call her in antibiotic, the last one we gave her really worked.  Patient is on her way home and declined to be seen tonight.  Patient asks if she can come in first thing tomorrow.  Precautions given that if develops fever, increased pain or redness, should go directly to [facility]. Appt scheduled for 10 am. Also advised I will need to review this with Dr Quincy Simmonds and call her back.

## 2013-05-07 ENCOUNTER — Ambulatory Visit (INDEPENDENT_AMBULATORY_CARE_PROVIDER_SITE_OTHER): Payer: BC Managed Care – PPO | Admitting: Obstetrics and Gynecology

## 2013-05-07 ENCOUNTER — Encounter: Payer: Self-pay | Admitting: Obstetrics and Gynecology

## 2013-05-07 VITALS — BP 117/75 | HR 83 | Resp 16 | Wt 153.0 lb

## 2013-05-07 DIAGNOSIS — IMO0002 Reserved for concepts with insufficient information to code with codable children: Secondary | ICD-10-CM

## 2013-05-07 DIAGNOSIS — T148XXA Other injury of unspecified body region, initial encounter: Secondary | ICD-10-CM

## 2013-05-07 MED ORDER — SULFAMETHOXAZOLE-TMP DS 800-160 MG PO TABS: 1.0000 | ORAL_TABLET | Freq: Two times a day (BID) | ORAL | Status: DC

## 2013-05-07 NOTE — Telephone Encounter (Signed)
See OV note.  Encounter closed.

## 2013-05-07 NOTE — Progress Notes (Signed)
Patient ID: Mikayla Greer, female   DOB: 03/23/1966, 48 y.o.   MRN: [id number]  Subjective  Patient is here for an incision check. Status post TAH/BSo with collection of pelvic washings on 02/03/13.  Having recurrent episodes of incisional pain and then bubbling up.  No fever.  Problem is in the same location.   Skin is red and sore. Going on a cruise and is worried about having the problem while she is out of the country.   Was treated with Keflex starting March 19, 2103. Last time responded to Bactrim DS about 2 weeks ago. No culture to date.   No other skin lesions.  No history of MRSA.  Objective  Pfannenstiel incision is intact with minimal erythema of the right apex.  No fluctuance.  Minimally tender.  No drainage noted.  Assessment  Wound pain.  Possible developing recurrent cellulitis versus skin reaction due to suture granuloma.  Plan  I will give the patient an Rx for Bactrim DS 1 po bid for one week that she may take with her on her cruise.  She is instructed to begin the antibiotic if she develops increasing pain, redness, or drainage.  Follow up prn.  10 minutes face to face time of which 50% was spent in counseling.   After visit summary to patient.

## 2013-05-07 NOTE — Patient Instructions (Signed)
Please take the Bactrim DS if you have increasing pain or redness from the incision while you are on your cruise!

## 2013-06-04 ENCOUNTER — Ambulatory Visit: Payer: BC Managed Care – PPO | Admitting: Obstetrics and Gynecology

## 2013-06-25 ENCOUNTER — Other Ambulatory Visit: Payer: Self-pay

## 2013-06-25 DIAGNOSIS — Z1231 Encounter for screening mammogram for malignant neoplasm of breast: Secondary | ICD-10-CM

## 2013-07-23 ENCOUNTER — Ambulatory Visit
Admission: RE | Admit: 2013-07-23 | Discharge: 2013-07-23 | Disposition: A | Discharge status: Home / Self Care | Payer: BC Managed Care – PPO | Source: Ambulatory Visit

## 2013-07-23 DIAGNOSIS — Z1231 Encounter for screening mammogram for malignant neoplasm of breast: Secondary | ICD-10-CM

## 2013-07-29 ENCOUNTER — Other Ambulatory Visit: Payer: Self-pay | Admitting: Obstetrics and Gynecology

## 2013-07-29 ENCOUNTER — Other Ambulatory Visit (HOSPITAL_COMMUNITY): Payer: Self-pay | Admitting: *Deleted

## 2013-07-29 DIAGNOSIS — R928 Other abnormal and inconclusive findings on diagnostic imaging of breast: Secondary | ICD-10-CM

## 2013-08-07 ENCOUNTER — Ambulatory Visit
Admission: RE | Admit: 2013-08-07 | Discharge: 2013-08-07 | Disposition: A | Discharge status: Home / Self Care | Payer: BC Managed Care – PPO | Source: Ambulatory Visit | Attending: Obstetrics and Gynecology | Admitting: Obstetrics and Gynecology

## 2013-08-07 DIAGNOSIS — R928 Other abnormal and inconclusive findings on diagnostic imaging of breast: Secondary | ICD-10-CM

## 2013-09-08 ENCOUNTER — Other Ambulatory Visit: Payer: Self-pay | Admitting: Internal Medicine

## 2013-09-08 DIAGNOSIS — E041 Nontoxic single thyroid nodule: Secondary | ICD-10-CM

## 2013-09-09 ENCOUNTER — Ambulatory Visit
Admission: RE | Admit: 2013-09-09 | Discharge: 2013-09-09 | Disposition: A | Discharge status: Home / Self Care | Payer: BC Managed Care – PPO | Source: Ambulatory Visit | Attending: Internal Medicine | Admitting: Internal Medicine

## 2013-09-09 DIAGNOSIS — E041 Nontoxic single thyroid nodule: Secondary | ICD-10-CM

## 2013-09-14 ENCOUNTER — Other Ambulatory Visit: Payer: BC Managed Care – PPO

## 2013-10-19 ENCOUNTER — Ambulatory Visit: Payer: BC Managed Care – PPO | Admitting: Obstetrics and Gynecology

## 2013-10-30 ENCOUNTER — Ambulatory Visit: Payer: BC Managed Care – PPO | Admitting: Obstetrics and Gynecology

## 2013-12-08 ENCOUNTER — Encounter: Payer: Self-pay | Admitting: Obstetrics and Gynecology

## 2014-02-01 ENCOUNTER — Encounter: Payer: Self-pay | Admitting: Obstetrics and Gynecology

## 2014-02-10 ENCOUNTER — Telehealth: Payer: Self-pay | Admitting: Obstetrics and Gynecology

## 2014-02-10 ENCOUNTER — Ambulatory Visit: Payer: BC Managed Care – PPO | Admitting: Obstetrics and Gynecology

## 2014-02-10 NOTE — Telephone Encounter (Signed)
Thank you for the update!

## 2014-02-10 NOTE — Telephone Encounter (Signed)
Patient left a message on the answering machine that she has a cold and fever today and needed to reschedule her AEX with Dr. Quincy Simmonds today at 8:30 a.m.. I called the patient back and rescheduled her to 02/12/14 with Johny Shock, CNM. FYI only.

## 2014-02-12 ENCOUNTER — Ambulatory Visit: Payer: BC Managed Care – PPO | Admitting: Certified Nurse Midwife

## 2014-03-01 ENCOUNTER — Other Ambulatory Visit: Payer: Self-pay | Admitting: Obstetrics and Gynecology

## 2014-03-02 NOTE — Telephone Encounter (Addendum)
Last refill 02/11/13 #90/ 3 refills Next appt 04/21/14 Last AEX 10/17/12  MMG TOMO Bilateral 07/2013 BIRADS0: Incomplete MMG Diagnostic Right 07/28/13 BIRADS1: neg  Please advise

## 2014-04-20 ENCOUNTER — Other Ambulatory Visit: Payer: Self-pay | Admitting: Dermatology

## 2014-04-21 ENCOUNTER — Ambulatory Visit (INDEPENDENT_AMBULATORY_CARE_PROVIDER_SITE_OTHER): Payer: BLUE CROSS/BLUE SHIELD | Admitting: Certified Nurse Midwife

## 2014-04-21 ENCOUNTER — Encounter: Payer: Self-pay | Admitting: Certified Nurse Midwife

## 2014-04-21 VITALS — BP 108/60 | HR 84 | Resp 18 | Ht 65.0 in | Wt 164.0 lb

## 2014-04-21 DIAGNOSIS — N951 Menopausal and female climacteric states: Secondary | ICD-10-CM

## 2014-04-21 DIAGNOSIS — Z01419 Encounter for gynecological examination (general) (routine) without abnormal findings: Secondary | ICD-10-CM

## 2014-04-21 DIAGNOSIS — Z1211 Encounter for screening for malignant neoplasm of colon: Secondary | ICD-10-CM

## 2014-04-21 MED ORDER — ESTRADIOL 0.5 MG PO TABS: 0.5000 mg | ORAL_TABLET | Freq: Every day | ORAL | Status: DC

## 2014-04-21 NOTE — Patient Instructions (Signed)

## 2014-04-21 NOTE — Progress Notes (Signed)
49 y.o. G30P1001 Married  Caucasian Fe here for annual exam.  Perimenopausal with occasional hot flashes., no insomnia. Denies vaginal dryness. Happy with hysterectomy choice for fibroids and bleeding. ERT working well, no issues. Sees PCP for aex/labs and medication for anxiety, all stable. No health issues today.  Patient's last menstrual period was 01/16/2013.          Sexually active: Yes.    The current method of family planning is status post hysterectomy.    Exercising: No.  The patient does not participate in regular exercise at present. Smoker:  no  Health Maintenance: Pap: 09/2011 WNL MMG: 07/28/13 BIRADS0:Incomplete. MMG Diagnostic Right 08/07/13 BIRADS1:Neg has class c density 3 D yearly recommended. Colonoscopy: Never BMD:   Never TDaP:  2013 Labs: none does with PCP    reports that she has never smoked. She has never used smokeless tobacco. She reports that she drinks about 0.6 oz of alcohol per week. She reports that she does not use illicit drugs.  Past Medical History  Diagnosis Date  . Anxiety   . Fibroid   . SVD (spontaneous vaginal delivery)     x 1  . Anemia     Past Surgical History  Procedure Laterality Date  . Hysteroscopy    . Dilation and curettage of uterus    . Abdominal hysterectomy N/A 02/03/2013    Procedure: HYSTERECTOMY ABDOMINAL;  Surgeon: Arloa Koh, MD;  Location: [facility];  Service: Gynecology;  Laterality: N/A;  with peritoneal washings  . Salpingoophorectomy Bilateral 02/03/2013    Procedure: SALPINGO OOPHORECTOMY;  Surgeon: Arloa Koh, MD;  Location: [facility];  Service: Gynecology;  Laterality: Bilateral;    Current Outpatient Prescriptions  Medication Sig Dispense Refill  . ALPRAZolam (XANAX) 0.5 MG tablet Take 1 tablet by mouth as needed.    . Calcium-Magnesium-Vitamin D (CALCIUM 1200+D3 PO) Take 1 tablet by mouth daily.    Marland Kitchen escitalopram (LEXAPRO) 20 MG tablet Take 1 tablet by mouth daily.    Marland Kitchen estradiol (ESTRACE) 0.5 MG tablet TAKE 1  TABLET DAILY 90 tablet 0  . Nutritional Supplements (ESTROVEN PO) Take by mouth 2 (two) times daily.     No current facility-administered medications for this visit.    Family History  Problem Relation Age of Onset  . Rheum arthritis Mother   . Breast cancer Paternal Grandmother     ROS:  Pertinent items are noted in HPI.  Otherwise, a comprehensive ROS was negative.  Exam:   BP 108/60 mmHg  Pulse 84  Resp 18  Ht 5\' 5"  (1.651 m)  Wt 164 lb (74.39 kg)  BMI 27.29 kg/m2  LMP 01/16/2013 Height: 5\' 5"  (165.1 cm) Ht Readings from Last 3 Encounters:  04/21/14 5\' 5"  (1.651 m)  04/20/13 5\' 5"  (1.651 m)  04/15/13 5\' 5"  (1.651 m)    General appearance: alert, cooperative and appears stated age Head: Normocephalic, without obvious abnormality, atraumatic Neck: no adenopathy, supple, symmetrical, trachea midline and thyroid normal to inspection and palpation Lungs: clear to auscultation bilaterally Breasts: normal appearance, no masses or tenderness, No nipple retraction or dimpling, No nipple discharge or bleeding, No axillary or supraclavicular adenopathy Heart: regular rate and rhythm Abdomen: soft, non-tender; no masses,  no organomegaly Extremities: extremities normal, atraumatic, no cyanosis or edema Skin: Skin color, texture, turgor normal. No rashes or lesions Lymph nodes: Cervical, supraclavicular, and axillary nodes normal. No abnormal inguinal nodes palpated Neurologic: Grossly normal   Pelvic: External genitalia:  no lesions  Urethra:  normal appearing urethra with no masses, tenderness or lesions              Bartholin's and Skene's: normal                 Vagina: normal appearing vagina with normal color and discharge, no lesions              Cervix: absent              Pap taken: No. Bimanual Exam:  Uterus:  uterus absent              Adnexa: no mass, fullness, tenderness and adnexa absent bilateral               Rectovaginal: Confirms                Anus:  normal sphincter tone, no lesions    A:  Well Woman with normal exam  Menopausal on ERT doing well S/P TAH with BSO bleeding and fibroids  Anxiety on stable medication with PCP management  Colonoscopy due  P:   Reviewed health and wellness pertinent to exam  Rx Estrace see order  Continue MD follow up as indicated  Discussed with patient risks and benefits, patient would like to schedule with Dr. Collene Mares  Referral will be made and patient will be contacted with information regarding appointment.  Pap smear not taken today   counseled on breast self exam, mammography screening, adequate intake of calcium and vitamin D, diet and exercise, Kegel's exercises  return annually or prn  An After Visit Summary was printed and given to the patient.

## 2014-04-25 NOTE — Progress Notes (Signed)
Reviewed personally.  M. Suzanne Rigel Filsinger, MD.  

## 2014-07-02 ENCOUNTER — Ambulatory Visit (HOSPITAL_COMMUNITY)
Admission: RE | Admit: 2014-07-02 | Discharge: 2014-07-02 | Disposition: A | Discharge status: Home / Self Care | Payer: BLUE CROSS/BLUE SHIELD | Source: Ambulatory Visit | Attending: Gastroenterology | Admitting: Gastroenterology

## 2014-07-02 ENCOUNTER — Encounter (HOSPITAL_COMMUNITY)
Admission: RE | Disposition: A | Discharge status: Home / Self Care | Payer: Self-pay | Source: Ambulatory Visit | Attending: Gastroenterology

## 2014-07-02 SURGERY — COLONOSCOPY
Anesthesia: Moderate Sedation

## 2014-08-17 ENCOUNTER — Encounter: Payer: Self-pay | Admitting: Obstetrics and Gynecology

## 2014-09-01 ENCOUNTER — Other Ambulatory Visit: Payer: Self-pay | Admitting: Internal Medicine

## 2014-09-01 DIAGNOSIS — E041 Nontoxic single thyroid nodule: Secondary | ICD-10-CM

## 2014-09-06 ENCOUNTER — Ambulatory Visit
Admission: RE | Admit: 2014-09-06 | Discharge: 2014-09-06 | Disposition: A | Discharge status: Home / Self Care | Payer: BLUE CROSS/BLUE SHIELD | Source: Ambulatory Visit | Attending: Internal Medicine | Admitting: Internal Medicine

## 2014-09-06 DIAGNOSIS — E041 Nontoxic single thyroid nodule: Secondary | ICD-10-CM

## 2014-09-20 ENCOUNTER — Other Ambulatory Visit: Payer: Self-pay

## 2014-09-20 DIAGNOSIS — Z1231 Encounter for screening mammogram for malignant neoplasm of breast: Secondary | ICD-10-CM

## 2014-11-01 ENCOUNTER — Ambulatory Visit
Admission: RE | Admit: 2014-11-01 | Discharge: 2014-11-01 | Disposition: A | Discharge status: Home / Self Care | Payer: BLUE CROSS/BLUE SHIELD | Source: Ambulatory Visit

## 2014-11-01 DIAGNOSIS — Z1231 Encounter for screening mammogram for malignant neoplasm of breast: Secondary | ICD-10-CM

## 2015-04-27 ENCOUNTER — Ambulatory Visit: Payer: BLUE CROSS/BLUE SHIELD | Admitting: Certified Nurse Midwife

## 2015-06-30 ENCOUNTER — Ambulatory Visit (INDEPENDENT_AMBULATORY_CARE_PROVIDER_SITE_OTHER): Payer: BLUE CROSS/BLUE SHIELD | Admitting: Obstetrics and Gynecology

## 2015-06-30 ENCOUNTER — Encounter: Payer: Self-pay | Admitting: Obstetrics and Gynecology

## 2015-06-30 VITALS — BP 120/82 | HR 80 | Resp 16 | Ht 64.75 in

## 2015-06-30 DIAGNOSIS — N951 Menopausal and female climacteric states: Secondary | ICD-10-CM | POA: Diagnosis not present

## 2015-06-30 DIAGNOSIS — Z9223 Personal history of estrogen therapy: Secondary | ICD-10-CM | POA: Diagnosis not present

## 2015-06-30 DIAGNOSIS — R319 Hematuria, unspecified: Secondary | ICD-10-CM | POA: Diagnosis not present

## 2015-06-30 DIAGNOSIS — N393 Stress incontinence (female) (male): Secondary | ICD-10-CM | POA: Diagnosis not present

## 2015-06-30 DIAGNOSIS — Z0000 Encounter for general adult medical examination without abnormal findings: Secondary | ICD-10-CM | POA: Diagnosis not present

## 2015-06-30 DIAGNOSIS — Z01419 Encounter for gynecological examination (general) (routine) without abnormal findings: Secondary | ICD-10-CM | POA: Diagnosis not present

## 2015-06-30 LAB — POCT URINALYSIS DIPSTICK
Bilirubin, UA: NEGATIVE
Glucose, UA: NEGATIVE
Ketones, UA: NEGATIVE
LEUKOCYTES UA: NEGATIVE
Nitrite, UA: NEGATIVE
PROTEIN UA: NEGATIVE
Urobilinogen, UA: NEGATIVE
pH, UA: 5

## 2015-06-30 MED ORDER — ESTRADIOL 0.5 MG PO TABS: 0.5000 mg | ORAL_TABLET | Freq: Every day | ORAL | Status: DC

## 2015-06-30 NOTE — Progress Notes (Signed)
Patient ID: Mikayla Greer, female   DOB: 09-08-1965, 50 y.o.   MRN: [id number] 50 y.o. G56P1001 Married Caucasian female here for annual exam.    Really happy to have had hysterectomy.  Taking estradiol for hot flashes.   Occasionally leaks with cough or laugh.  Leaks with vomiting if has illness. Wears a pad.    PCP: Carol Ada, MD     Patient's last menstrual period was 01/16/2013.           Sexually active: Yes.  female  The current method of family planning is status post hysterectomy.   Status post TAH/BSO. Exercising: Yes.    walking and treadmill. Smoker:  no  Health Maintenance: Pap:  09/2011 Neg History of abnormal Pap:  no MMG:  11-01-14 3D/Density Cat.C/Neg/BiRads1:[facility] Colonoscopy:  2016 normal with Dr. Lenise Herald 2026 BMD:   n/a  Result  n/a TDaP:  2013 Gardasil:   no Screening Labs:  Hb today: PCO, Urine today: 1+RBCs.  Occasionally has has right lower quadrant pain.  No dysuria.    reports that she has never smoked. She has never used smokeless tobacco. She reports that she drinks about 1.2 - 1.8 oz of alcohol per week. She reports that she does not use illicit drugs.  Past Medical History  Diagnosis Date  . Anxiety   . Fibroid   . SVD (spontaneous vaginal delivery)     x 1  . Anemia   . Multiple thyroid nodules     Followed by Dr. Buddy Duty.    Past Surgical History  Procedure Laterality Date  . Hysteroscopy    . Dilation and curettage of uterus    . Abdominal hysterectomy N/A 02/03/2013    Procedure: HYSTERECTOMY ABDOMINAL;  Surgeon: Arloa Koh, MD;  Location: [facility];  Service: Gynecology;  Laterality: N/A;  with peritoneal washings  . Salpingoophorectomy Bilateral 02/03/2013    Procedure: SALPINGO OOPHORECTOMY;  Surgeon: Arloa Koh, MD;  Location: [facility];  Service: Gynecology;  Laterality: Bilateral;    Current Outpatient Prescriptions  Medication Sig Dispense Refill  . ALPRAZolam (XANAX) 0.5 MG tablet Take 1 tablet by mouth as  needed.    . Calcium-Magnesium-Vitamin D (CALCIUM 1200+D3 PO) Take 1 tablet by mouth daily.    Marland Kitchen escitalopram (LEXAPRO) 10 MG tablet     . escitalopram (LEXAPRO) 20 MG tablet Take 1 tablet by mouth daily. Patient takes 15mg  daily    . estradiol (ESTRACE) 0.5 MG tablet Take 1 tablet (0.5 mg total) by mouth daily. 90 tablet 3  . Nutritional Supplements (ESTROVEN PO) Take by mouth 2 (two) times daily.     No current facility-administered medications for this visit.    Family History  Problem Relation Age of Onset  . Rheum arthritis Mother   . Breast cancer Paternal Grandmother     ROS:  Pertinent items are noted in HPI.  Otherwise, a comprehensive ROS was negative.  Exam:   BP 120/82 mmHg  Pulse 80  Resp 16  Ht 5' 4.75" (1.645 m)  Wt   LMP 01/16/2013    General appearance: alert, cooperative and appears stated age Head: Normocephalic, without obvious abnormality, atraumatic Neck: no adenopathy, supple, symmetrical, trachea midline and thyroid Right thyroid - 2.5 cm, nontender.  Dr. Buddy Duty follows. Lungs: clear to auscultation bilaterally Breasts: normal appearance, no masses or tenderness, fibrocystic change at 6:00 bilateral breasts.  No nodes, retractions, nipple discharge, or axillay adenopathy.   Bilateral nipple rings. Heart: regular rate and  rhythm Abdomen: incisions:  Yes.   Pfannenstiel incision.  , soft, non-tender; no masses, no organomegaly Extremities: extremities normal, atraumatic, no cyanosis or edema Skin: Skin color, texture, turgor normal. No rashes or lesions Lymph nodes: Cervical, supraclavicular, and axillary nodes normal. No abnormal inguinal nodes palpated Neurologic: Grossly normal  Pelvic: External genitalia:  no lesions              Urethra:  normal appearing urethra with no masses, tenderness or lesions              Bartholins and Skenes: normal                 Vagina: normal appearing vagina with normal color and discharge, no lesions               Cervix: absent              Pap taken: No. Bimanual Exam:  Uterus:  uterus absent              Adnexa: no mass, fullness, tenderness              Rectal exam: Yes.  .  Confirms.              Anus:  normal sphincter tone, no lesions  Chaperone was present for exam.  Assessment:   Well woman visit with normal exam. Status post TAH/BSO.  ERT patient.  Microscopic hematuria.  Thyroid nodules. Genuine stress incontinence.  Plan: Yearly mammogram recommended after age 36.  Recommended self breast exam.  Pap and HR HPV as above. Discussed Calcium, Vitamin D, regular exercise program including cardiovascular and weight bearing exercise. Labs performed.   Yes.  Urine micro and culture.  Prescription medication(s) given.  Yes.  .  See orders.  Continue Estrace 0.5 mg daily.  Discussed potential risks of DVT, PE, MI, stroke, and breast cancer.   Patient wishes to continue. Kegel exercises for incontinence.   Discussed PT and midurethral sling briefly if desires. Follow up annually and prn.    After visit summary provided.

## 2015-06-30 NOTE — Patient Instructions (Signed)

## 2015-07-01 LAB — URINE CULTURE: Colony Count: 6000

## 2015-07-01 LAB — URINALYSIS, MICROSCOPIC ONLY
Bacteria, UA: NONE SEEN [HPF]
CASTS: NONE SEEN [LPF]
CRYSTALS: NONE SEEN [HPF]
RBC / HPF: NONE SEEN RBC/HPF (ref <=2)
WBC, UA: NONE SEEN WBC/HPF (ref <=5)
YEAST: NONE SEEN [HPF]

## 2015-10-17 ENCOUNTER — Other Ambulatory Visit: Payer: Self-pay | Admitting: Obstetrics and Gynecology

## 2015-10-17 DIAGNOSIS — Z1231 Encounter for screening mammogram for malignant neoplasm of breast: Secondary | ICD-10-CM

## 2015-11-02 DIAGNOSIS — G4700 Insomnia, unspecified: Secondary | ICD-10-CM | POA: Diagnosis not present

## 2015-11-02 DIAGNOSIS — F411 Generalized anxiety disorder: Secondary | ICD-10-CM | POA: Diagnosis not present

## 2015-11-02 DIAGNOSIS — E7800 Pure hypercholesterolemia, unspecified: Secondary | ICD-10-CM | POA: Diagnosis not present

## 2015-11-03 ENCOUNTER — Ambulatory Visit: Payer: BLUE CROSS/BLUE SHIELD

## 2015-11-08 ENCOUNTER — Ambulatory Visit
Admission: RE | Admit: 2015-11-08 | Discharge: 2015-11-08 | Disposition: A | Discharge status: Home / Self Care | Payer: BLUE CROSS/BLUE SHIELD | Source: Ambulatory Visit | Attending: Obstetrics and Gynecology | Admitting: Obstetrics and Gynecology

## 2015-11-08 DIAGNOSIS — Z1231 Encounter for screening mammogram for malignant neoplasm of breast: Secondary | ICD-10-CM | POA: Diagnosis not present

## 2015-11-09 ENCOUNTER — Other Ambulatory Visit: Payer: Self-pay | Admitting: Obstetrics and Gynecology

## 2015-11-09 DIAGNOSIS — R928 Other abnormal and inconclusive findings on diagnostic imaging of breast: Secondary | ICD-10-CM

## 2015-11-14 ENCOUNTER — Ambulatory Visit
Admission: RE | Admit: 2015-11-14 | Discharge: 2015-11-14 | Disposition: A | Discharge status: Home / Self Care | Payer: BLUE CROSS/BLUE SHIELD | Source: Ambulatory Visit | Attending: Obstetrics and Gynecology | Admitting: Obstetrics and Gynecology

## 2015-11-14 DIAGNOSIS — R928 Other abnormal and inconclusive findings on diagnostic imaging of breast: Secondary | ICD-10-CM

## 2015-11-14 DIAGNOSIS — R922 Inconclusive mammogram: Secondary | ICD-10-CM | POA: Diagnosis not present

## 2015-11-24 DIAGNOSIS — M5416 Radiculopathy, lumbar region: Secondary | ICD-10-CM | POA: Diagnosis not present

## 2015-11-24 DIAGNOSIS — M545 Low back pain: Secondary | ICD-10-CM | POA: Diagnosis not present

## 2015-11-24 DIAGNOSIS — M461 Sacroiliitis, not elsewhere classified: Secondary | ICD-10-CM | POA: Diagnosis not present

## 2015-12-01 DIAGNOSIS — M6281 Muscle weakness (generalized): Secondary | ICD-10-CM | POA: Diagnosis not present

## 2015-12-01 DIAGNOSIS — M545 Low back pain: Secondary | ICD-10-CM | POA: Diagnosis not present

## 2015-12-01 DIAGNOSIS — M2560 Stiffness of unspecified joint, not elsewhere classified: Secondary | ICD-10-CM | POA: Diagnosis not present

## 2015-12-14 DIAGNOSIS — M2560 Stiffness of unspecified joint, not elsewhere classified: Secondary | ICD-10-CM | POA: Diagnosis not present

## 2015-12-14 DIAGNOSIS — M545 Low back pain: Secondary | ICD-10-CM | POA: Diagnosis not present

## 2015-12-14 DIAGNOSIS — M6281 Muscle weakness (generalized): Secondary | ICD-10-CM | POA: Diagnosis not present

## 2015-12-14 DIAGNOSIS — M5416 Radiculopathy, lumbar region: Secondary | ICD-10-CM | POA: Diagnosis not present

## 2015-12-28 DIAGNOSIS — M5416 Radiculopathy, lumbar region: Secondary | ICD-10-CM | POA: Diagnosis not present

## 2015-12-28 DIAGNOSIS — M545 Low back pain: Secondary | ICD-10-CM | POA: Diagnosis not present

## 2015-12-28 DIAGNOSIS — M2560 Stiffness of unspecified joint, not elsewhere classified: Secondary | ICD-10-CM | POA: Diagnosis not present

## 2015-12-28 DIAGNOSIS — M6281 Muscle weakness (generalized): Secondary | ICD-10-CM | POA: Diagnosis not present

## 2016-02-28 DIAGNOSIS — Z85828 Personal history of other malignant neoplasm of skin: Secondary | ICD-10-CM | POA: Diagnosis not present

## 2016-02-28 DIAGNOSIS — D2262 Melanocytic nevi of left upper limb, including shoulder: Secondary | ICD-10-CM | POA: Diagnosis not present

## 2016-02-28 DIAGNOSIS — L821 Other seborrheic keratosis: Secondary | ICD-10-CM | POA: Diagnosis not present

## 2016-02-28 DIAGNOSIS — D1801 Hemangioma of skin and subcutaneous tissue: Secondary | ICD-10-CM | POA: Diagnosis not present

## 2016-03-10 DIAGNOSIS — R05 Cough: Secondary | ICD-10-CM | POA: Diagnosis not present

## 2016-03-10 DIAGNOSIS — J0100 Acute maxillary sinusitis, unspecified: Secondary | ICD-10-CM | POA: Diagnosis not present

## 2016-05-16 DIAGNOSIS — L738 Other specified follicular disorders: Secondary | ICD-10-CM | POA: Diagnosis not present

## 2016-05-16 DIAGNOSIS — D2239 Melanocytic nevi of other parts of face: Secondary | ICD-10-CM | POA: Diagnosis not present

## 2016-05-16 DIAGNOSIS — Z85828 Personal history of other malignant neoplasm of skin: Secondary | ICD-10-CM | POA: Diagnosis not present

## 2016-06-07 ENCOUNTER — Other Ambulatory Visit: Payer: Self-pay | Admitting: Obstetrics and Gynecology

## 2016-06-07 DIAGNOSIS — N951 Menopausal and female climacteric states: Secondary | ICD-10-CM

## 2016-06-07 MED ORDER — ESTRADIOL 0.5 MG PO TABS: 0.5000 mg | ORAL_TABLET | Freq: Every day | ORAL | 0 refills | Status: AC

## 2016-06-07 NOTE — Telephone Encounter (Signed)
Medication refill request: Estradiol   Last AEX:  06/30/15 BS Next AEX: 07/13/16 BS Last MMG (if hormonal medication request): 11/08/15 BIRADS0, Density C, TBC; 11/14/15 Dx L Breast BIRADS1 Refill authorized: 06/30/15 #90 3R. Please advise. Thank you.

## 2016-06-07 NOTE — Telephone Encounter (Signed)
Patient is requesting a refill of Estradiol,  [facility]. Patient is asking for refills of a 90 day supply with 2 refills.

## 2016-07-13 ENCOUNTER — Ambulatory Visit: Payer: BLUE CROSS/BLUE SHIELD | Admitting: Obstetrics and Gynecology

## 2016-09-26 ENCOUNTER — Other Ambulatory Visit: Payer: Self-pay | Admitting: Obstetrics and Gynecology

## 2016-09-26 DIAGNOSIS — N951 Menopausal and female climacteric states: Secondary | ICD-10-CM

## 2016-09-26 NOTE — Telephone Encounter (Signed)
Medication refill request: Estradiol 0.5mg  Last AEX:  06/30/15 BS Next AEX: will call to schedule Last MMG (if hormonal medication request): LEFT BREAST Diagnostic -- 11/14/15 BIRADS 1 negative/density c Refill authorized: 06/07/16 #90 w/0 refills; today please advise

## 2016-10-01 NOTE — Telephone Encounter (Signed)
Patient states that she did not request Estradiol refill. Request was sent from pharmacy. Per patient will call our office back once she has her schedule to schedule AEX.

## 2016-10-08 DIAGNOSIS — E7800 Pure hypercholesterolemia, unspecified: Secondary | ICD-10-CM | POA: Diagnosis not present

## 2016-10-08 DIAGNOSIS — Z0000 Encounter for general adult medical examination without abnormal findings: Secondary | ICD-10-CM | POA: Diagnosis not present

## 2016-10-08 DIAGNOSIS — F411 Generalized anxiety disorder: Secondary | ICD-10-CM | POA: Diagnosis not present

## 2016-10-08 DIAGNOSIS — Z79890 Hormone replacement therapy (postmenopausal): Secondary | ICD-10-CM | POA: Diagnosis not present

## 2016-10-08 DIAGNOSIS — Z1159 Encounter for screening for other viral diseases: Secondary | ICD-10-CM | POA: Diagnosis not present

## 2016-10-17 ENCOUNTER — Other Ambulatory Visit: Payer: Self-pay | Admitting: Family Medicine

## 2016-10-17 DIAGNOSIS — Z1231 Encounter for screening mammogram for malignant neoplasm of breast: Secondary | ICD-10-CM

## 2016-11-19 ENCOUNTER — Ambulatory Visit
Admission: RE | Admit: 2016-11-19 | Discharge: 2016-11-19 | Disposition: A | Discharge status: Home / Self Care | Payer: BLUE CROSS/BLUE SHIELD | Source: Ambulatory Visit | Attending: Family Medicine | Admitting: Family Medicine

## 2016-11-19 DIAGNOSIS — Z1231 Encounter for screening mammogram for malignant neoplasm of breast: Secondary | ICD-10-CM | POA: Diagnosis not present

## 2017-04-26 DIAGNOSIS — D1801 Hemangioma of skin and subcutaneous tissue: Secondary | ICD-10-CM | POA: Diagnosis not present

## 2017-04-26 DIAGNOSIS — D225 Melanocytic nevi of trunk: Secondary | ICD-10-CM | POA: Diagnosis not present

## 2017-04-26 DIAGNOSIS — Z85828 Personal history of other malignant neoplasm of skin: Secondary | ICD-10-CM | POA: Diagnosis not present

## 2017-04-26 DIAGNOSIS — D2239 Melanocytic nevi of other parts of face: Secondary | ICD-10-CM | POA: Diagnosis not present

## 2017-05-14 DIAGNOSIS — F411 Generalized anxiety disorder: Secondary | ICD-10-CM | POA: Diagnosis not present

## 2017-11-11 ENCOUNTER — Other Ambulatory Visit: Payer: Self-pay | Admitting: Family Medicine

## 2017-11-11 DIAGNOSIS — Z1231 Encounter for screening mammogram for malignant neoplasm of breast: Secondary | ICD-10-CM

## 2017-11-20 DIAGNOSIS — L720 Epidermal cyst: Secondary | ICD-10-CM | POA: Diagnosis not present

## 2017-11-20 DIAGNOSIS — D485 Neoplasm of uncertain behavior of skin: Secondary | ICD-10-CM | POA: Diagnosis not present

## 2017-11-20 DIAGNOSIS — L738 Other specified follicular disorders: Secondary | ICD-10-CM | POA: Diagnosis not present

## 2017-11-20 DIAGNOSIS — D1801 Hemangioma of skin and subcutaneous tissue: Secondary | ICD-10-CM | POA: Diagnosis not present

## 2017-11-20 DIAGNOSIS — Z85828 Personal history of other malignant neoplasm of skin: Secondary | ICD-10-CM | POA: Diagnosis not present

## 2017-11-20 DIAGNOSIS — I788 Other diseases of capillaries: Secondary | ICD-10-CM | POA: Diagnosis not present

## 2017-11-20 DIAGNOSIS — D3611 Benign neoplasm of peripheral nerves and autonomic nervous system of face, head, and neck: Secondary | ICD-10-CM | POA: Diagnosis not present

## 2017-12-04 ENCOUNTER — Ambulatory Visit
Admission: RE | Admit: 2017-12-04 | Discharge: 2017-12-04 | Disposition: A | Discharge status: Home / Self Care | Payer: BLUE CROSS/BLUE SHIELD | Source: Ambulatory Visit | Attending: Family Medicine | Admitting: Family Medicine

## 2017-12-04 DIAGNOSIS — Z1231 Encounter for screening mammogram for malignant neoplasm of breast: Secondary | ICD-10-CM | POA: Diagnosis not present

## 2018-03-20 DIAGNOSIS — F411 Generalized anxiety disorder: Secondary | ICD-10-CM | POA: Diagnosis not present

## 2018-06-23 DIAGNOSIS — Z8249 Family history of ischemic heart disease and other diseases of the circulatory system: Secondary | ICD-10-CM | POA: Diagnosis not present

## 2018-06-23 DIAGNOSIS — Z0000 Encounter for general adult medical examination without abnormal findings: Secondary | ICD-10-CM | POA: Diagnosis not present

## 2018-06-23 DIAGNOSIS — F411 Generalized anxiety disorder: Secondary | ICD-10-CM | POA: Diagnosis not present

## 2018-06-23 DIAGNOSIS — E7800 Pure hypercholesterolemia, unspecified: Secondary | ICD-10-CM | POA: Diagnosis not present

## 2018-06-23 DIAGNOSIS — E042 Nontoxic multinodular goiter: Secondary | ICD-10-CM | POA: Diagnosis not present

## 2018-11-06 ENCOUNTER — Other Ambulatory Visit: Payer: Self-pay | Admitting: Family Medicine

## 2018-11-06 DIAGNOSIS — Z1231 Encounter for screening mammogram for malignant neoplasm of breast: Secondary | ICD-10-CM

## 2018-12-09 DIAGNOSIS — E7800 Pure hypercholesterolemia, unspecified: Secondary | ICD-10-CM | POA: Diagnosis not present

## 2018-12-09 DIAGNOSIS — F411 Generalized anxiety disorder: Secondary | ICD-10-CM | POA: Diagnosis not present

## 2018-12-19 ENCOUNTER — Ambulatory Visit
Admission: RE | Admit: 2018-12-19 | Discharge: 2018-12-19 | Disposition: A | Discharge status: Home / Self Care | Payer: BLUE CROSS/BLUE SHIELD | Source: Ambulatory Visit | Attending: Family Medicine | Admitting: Family Medicine

## 2018-12-19 ENCOUNTER — Other Ambulatory Visit: Payer: Self-pay

## 2018-12-19 DIAGNOSIS — Z1231 Encounter for screening mammogram for malignant neoplasm of breast: Secondary | ICD-10-CM | POA: Diagnosis not present

## 2019-02-04 DIAGNOSIS — D225 Melanocytic nevi of trunk: Secondary | ICD-10-CM | POA: Diagnosis not present

## 2019-02-04 DIAGNOSIS — D2272 Melanocytic nevi of left lower limb, including hip: Secondary | ICD-10-CM | POA: Diagnosis not present

## 2019-02-04 DIAGNOSIS — L910 Hypertrophic scar: Secondary | ICD-10-CM | POA: Diagnosis not present

## 2019-02-04 DIAGNOSIS — Z85828 Personal history of other malignant neoplasm of skin: Secondary | ICD-10-CM | POA: Diagnosis not present

## 2019-04-23 IMAGING — MG DIGITAL SCREENING BILATERAL MAMMOGRAM WITH TOMO AND CAD
8 series · 8 of 24 positions shown · non-contrast
Comparison: Previous exam(s).

CLINICAL DATA: Screening.

EXAM:
DIGITAL SCREENING BILATERAL MAMMOGRAM WITH TOMO AND CAD

[R MLO synth-2D]
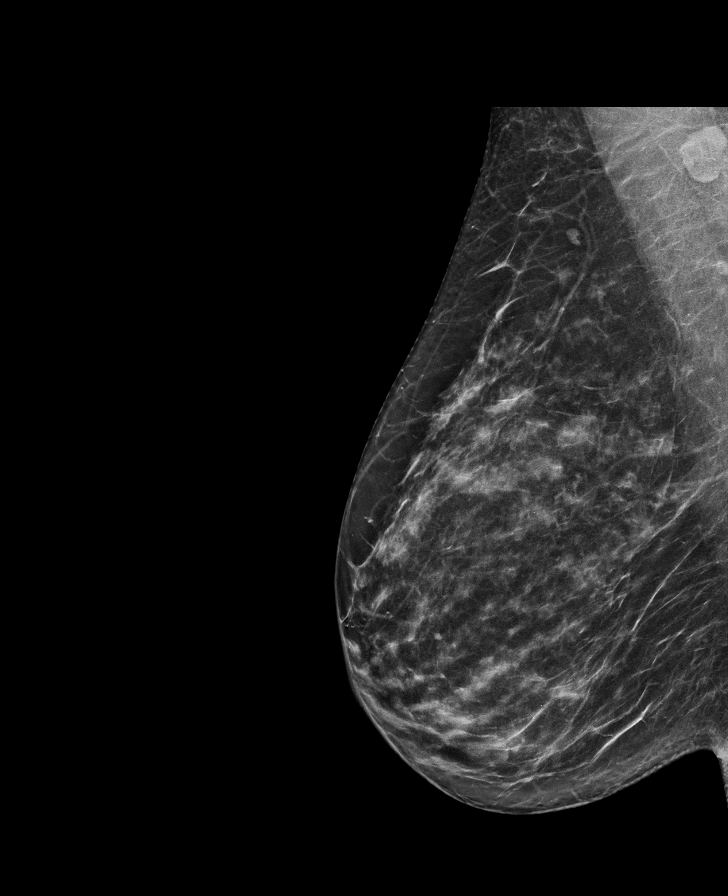

[L CC synth-2D]
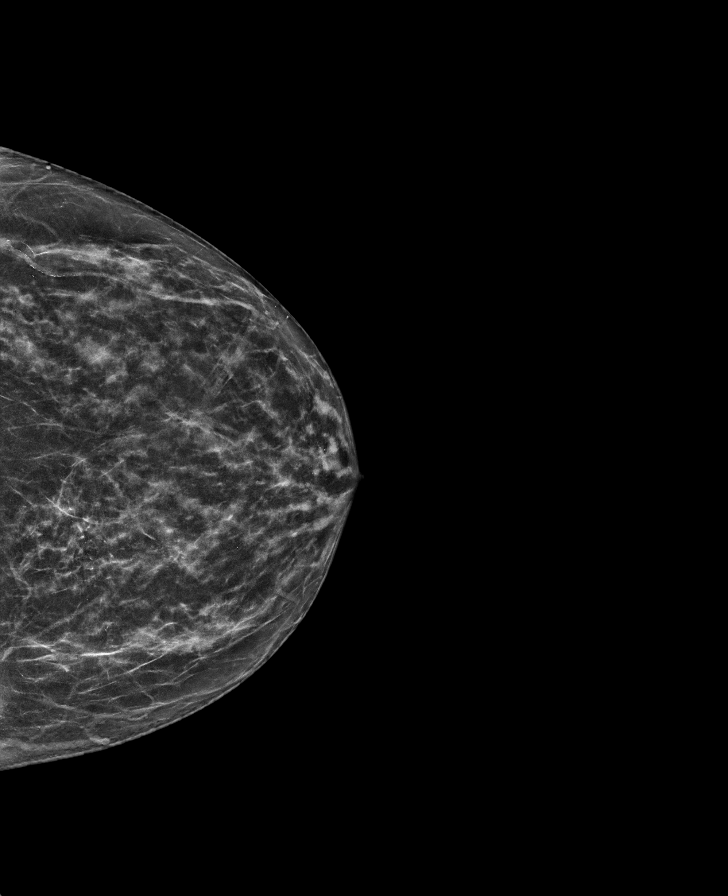

[R CC synth-2D]
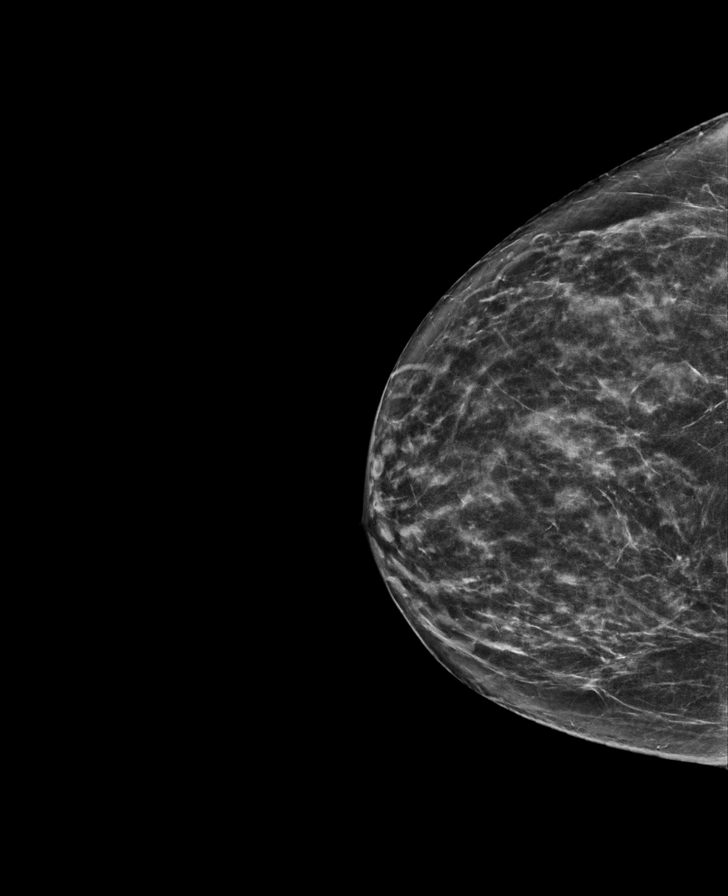

[L MLO synth-2D]
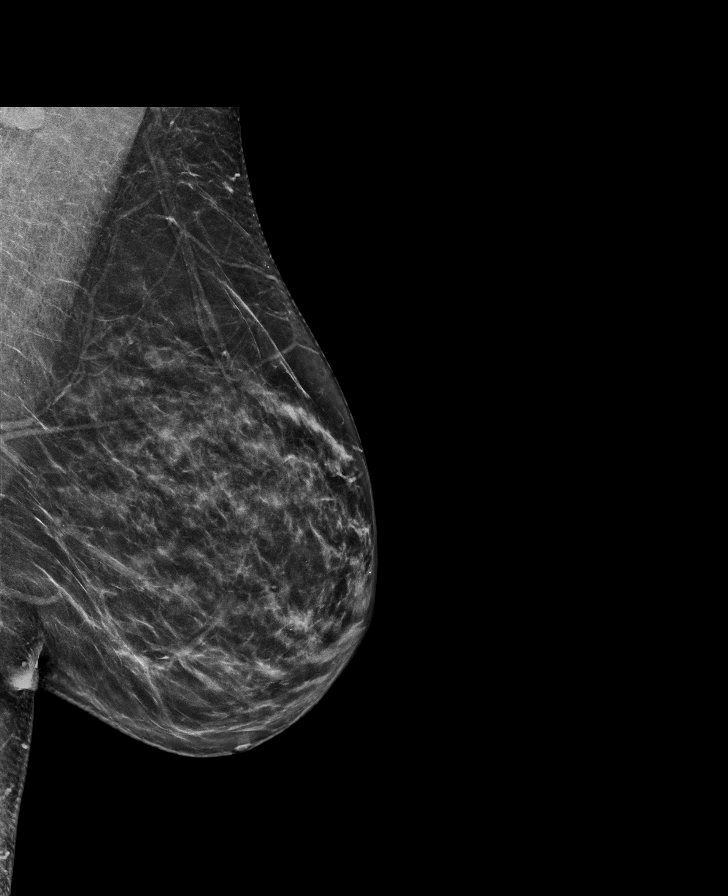

[R CC tomo · tomo slice 34/67.0]
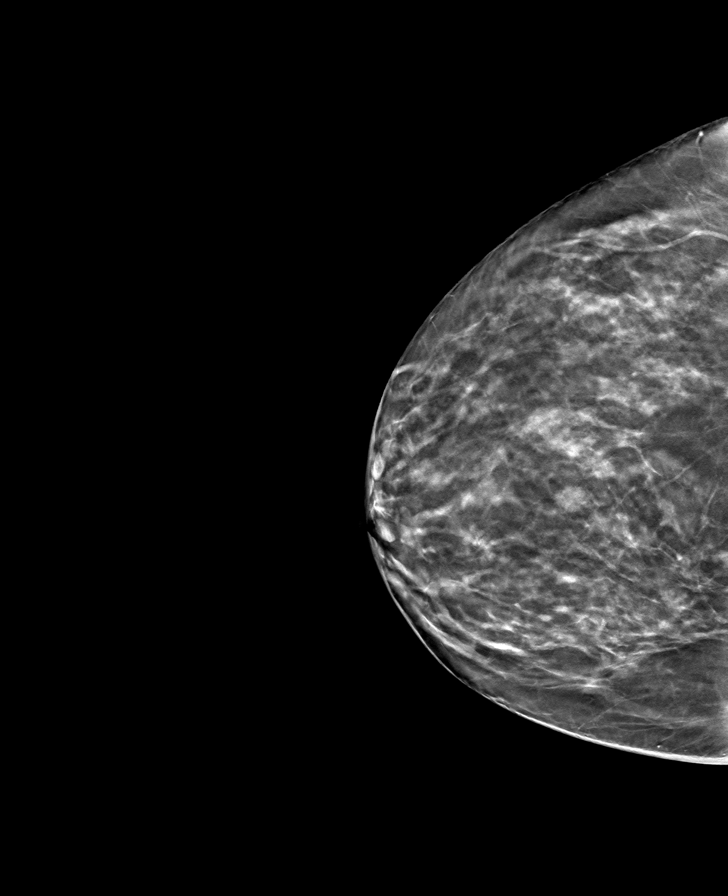

[L MLO tomo · tomo slice 35/68.0]
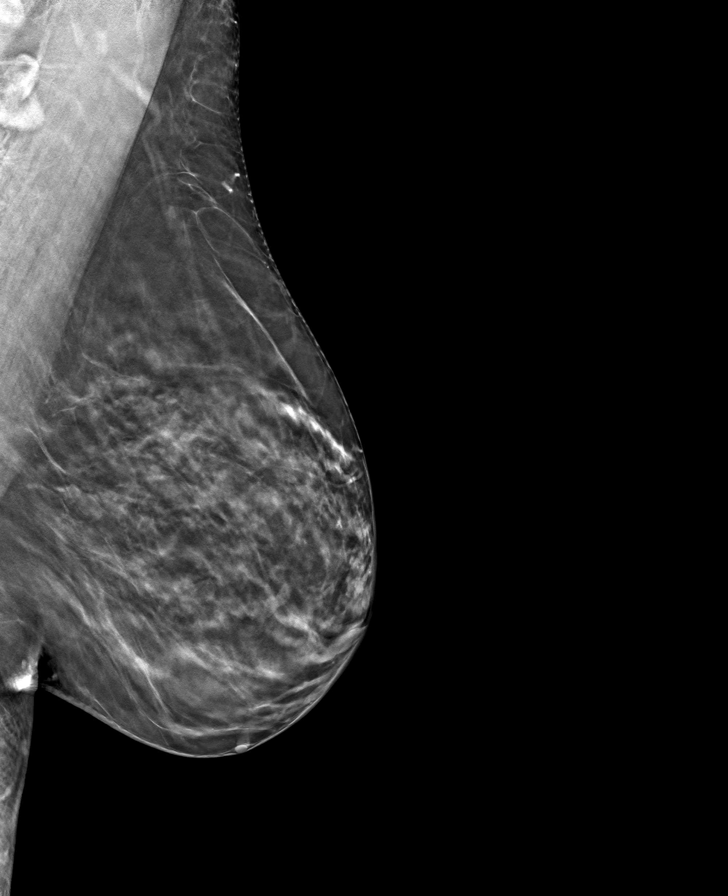

[L CC tomo · tomo slice 30/59.0]
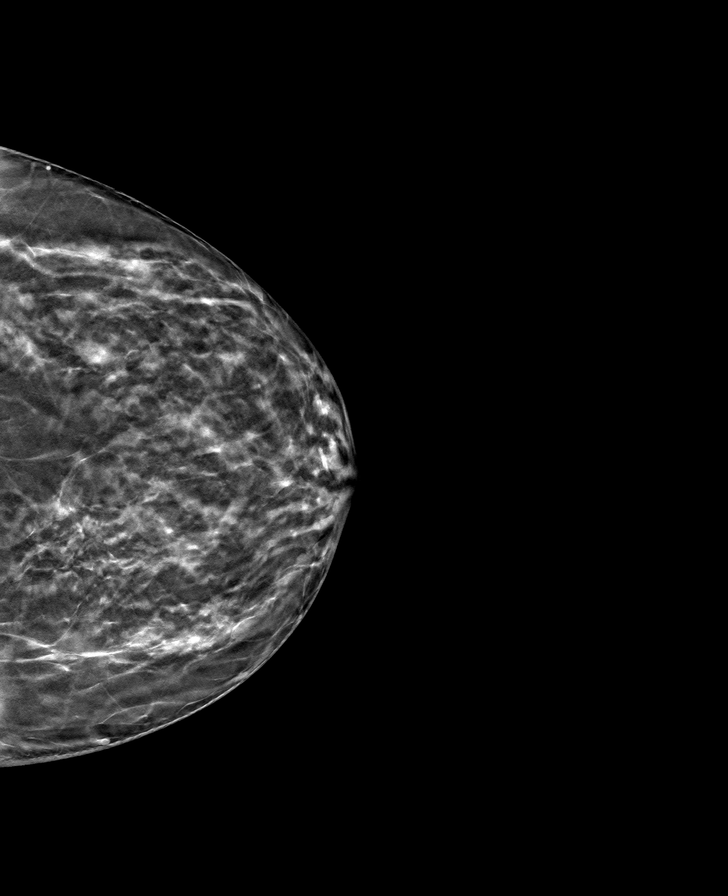

[R MLO tomo · tomo slice 36/71.0]
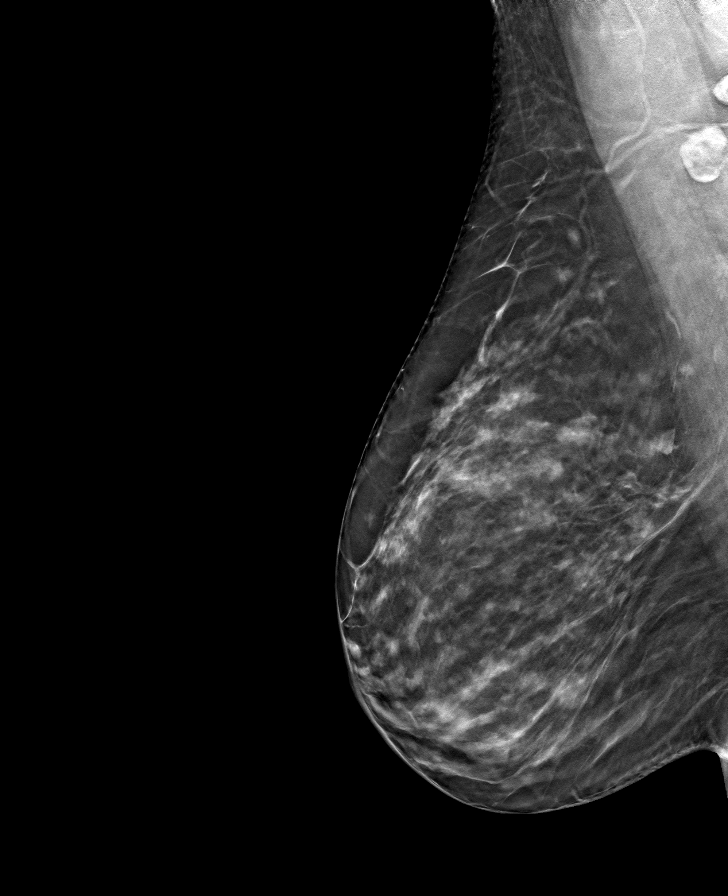

[8 of 24 positions shown; findings below may reference images not displayed]

ACR Breast Density Category c: The breast tissue is heterogeneously
dense, which may obscure small masses.
FINDINGS: There are no findings suspicious for malignancy. Images were
processed with CAD.
IMPRESSION: No mammographic evidence of malignancy. A result letter of this
screening mammogram will be mailed directly to the patient.

RECOMMENDATION:
Screening mammogram in one year. (Code:FT-U-LHB)

BI-RADS CATEGORY  1: Negative.

## 2019-06-25 DIAGNOSIS — Z0000 Encounter for general adult medical examination without abnormal findings: Secondary | ICD-10-CM | POA: Diagnosis not present

## 2019-06-25 DIAGNOSIS — E7800 Pure hypercholesterolemia, unspecified: Secondary | ICD-10-CM | POA: Diagnosis not present

## 2019-08-24 DIAGNOSIS — N3000 Acute cystitis without hematuria: Secondary | ICD-10-CM | POA: Diagnosis not present

## 2019-08-24 DIAGNOSIS — R300 Dysuria: Secondary | ICD-10-CM | POA: Diagnosis not present

## 2019-11-16 ENCOUNTER — Other Ambulatory Visit: Payer: Self-pay | Admitting: Family Medicine

## 2019-11-16 DIAGNOSIS — Z1231 Encounter for screening mammogram for malignant neoplasm of breast: Secondary | ICD-10-CM

## 2019-12-28 DIAGNOSIS — F411 Generalized anxiety disorder: Secondary | ICD-10-CM | POA: Diagnosis not present

## 2019-12-28 DIAGNOSIS — E042 Nontoxic multinodular goiter: Secondary | ICD-10-CM | POA: Diagnosis not present

## 2019-12-28 DIAGNOSIS — E7800 Pure hypercholesterolemia, unspecified: Secondary | ICD-10-CM | POA: Diagnosis not present

## 2019-12-28 DIAGNOSIS — G4700 Insomnia, unspecified: Secondary | ICD-10-CM | POA: Diagnosis not present

## 2020-01-04 ENCOUNTER — Other Ambulatory Visit: Payer: Self-pay

## 2020-01-04 ENCOUNTER — Ambulatory Visit
Admission: RE | Admit: 2020-01-04 | Discharge: 2020-01-04 | Disposition: A | Discharge status: Home / Self Care | Payer: BC Managed Care – PPO | Source: Ambulatory Visit | Attending: Family Medicine | Admitting: Family Medicine

## 2020-01-04 DIAGNOSIS — Z1231 Encounter for screening mammogram for malignant neoplasm of breast: Secondary | ICD-10-CM

## 2020-06-27 DIAGNOSIS — F411 Generalized anxiety disorder: Secondary | ICD-10-CM | POA: Diagnosis not present

## 2020-06-27 DIAGNOSIS — Z0000 Encounter for general adult medical examination without abnormal findings: Secondary | ICD-10-CM | POA: Diagnosis not present

## 2020-06-27 DIAGNOSIS — E7800 Pure hypercholesterolemia, unspecified: Secondary | ICD-10-CM | POA: Diagnosis not present

## 2020-12-01 ENCOUNTER — Other Ambulatory Visit: Payer: Self-pay | Admitting: Family Medicine

## 2020-12-01 DIAGNOSIS — Z1231 Encounter for screening mammogram for malignant neoplasm of breast: Secondary | ICD-10-CM

## 2021-01-10 ENCOUNTER — Ambulatory Visit: Payer: BC Managed Care – PPO

## 2021-02-06 ENCOUNTER — Ambulatory Visit
Admission: RE | Admit: 2021-02-06 | Discharge: 2021-02-06 | Disposition: A | Discharge status: Home / Self Care | Payer: BC Managed Care – PPO | Source: Ambulatory Visit | Attending: Family Medicine | Admitting: Family Medicine

## 2021-02-06 ENCOUNTER — Other Ambulatory Visit: Payer: Self-pay

## 2021-02-06 DIAGNOSIS — Z1231 Encounter for screening mammogram for malignant neoplasm of breast: Secondary | ICD-10-CM

## 2021-07-25 DIAGNOSIS — E7800 Pure hypercholesterolemia, unspecified: Secondary | ICD-10-CM | POA: Diagnosis not present

## 2021-07-25 DIAGNOSIS — Z0000 Encounter for general adult medical examination without abnormal findings: Secondary | ICD-10-CM | POA: Diagnosis not present

## 2021-07-25 DIAGNOSIS — Z23 Encounter for immunization: Secondary | ICD-10-CM | POA: Diagnosis not present

## 2022-01-15 ENCOUNTER — Other Ambulatory Visit: Payer: Self-pay | Admitting: Family Medicine

## 2022-01-15 DIAGNOSIS — Z1231 Encounter for screening mammogram for malignant neoplasm of breast: Secondary | ICD-10-CM

## 2022-03-01 ENCOUNTER — Ambulatory Visit
Admission: RE | Admit: 2022-03-01 | Discharge: 2022-03-01 | Disposition: A | Discharge status: Home / Self Care | Payer: BC Managed Care – PPO | Source: Ambulatory Visit | Attending: Family Medicine | Admitting: Family Medicine

## 2022-03-01 DIAGNOSIS — Z1231 Encounter for screening mammogram for malignant neoplasm of breast: Secondary | ICD-10-CM

## 2022-08-01 DIAGNOSIS — E7800 Pure hypercholesterolemia, unspecified: Secondary | ICD-10-CM | POA: Diagnosis not present

## 2022-08-01 DIAGNOSIS — F411 Generalized anxiety disorder: Secondary | ICD-10-CM | POA: Diagnosis not present

## 2022-08-01 DIAGNOSIS — R197 Diarrhea, unspecified: Secondary | ICD-10-CM | POA: Diagnosis not present

## 2022-08-01 DIAGNOSIS — Z0000 Encounter for general adult medical examination without abnormal findings: Secondary | ICD-10-CM | POA: Diagnosis not present

## 2022-08-01 DIAGNOSIS — E042 Nontoxic multinodular goiter: Secondary | ICD-10-CM | POA: Diagnosis not present

## 2023-01-28 ENCOUNTER — Other Ambulatory Visit: Payer: Self-pay | Admitting: Family Medicine

## 2023-01-28 DIAGNOSIS — Z1231 Encounter for screening mammogram for malignant neoplasm of breast: Secondary | ICD-10-CM

## 2023-02-04 DIAGNOSIS — E042 Nontoxic multinodular goiter: Secondary | ICD-10-CM | POA: Diagnosis not present

## 2023-02-04 DIAGNOSIS — E7800 Pure hypercholesterolemia, unspecified: Secondary | ICD-10-CM | POA: Diagnosis not present

## 2023-02-04 DIAGNOSIS — F411 Generalized anxiety disorder: Secondary | ICD-10-CM | POA: Diagnosis not present

## 2023-03-05 ENCOUNTER — Ambulatory Visit: Payer: BC Managed Care – PPO

## 2023-03-12 DIAGNOSIS — L309 Dermatitis, unspecified: Secondary | ICD-10-CM | POA: Diagnosis not present

## 2023-03-21 ENCOUNTER — Ambulatory Visit
Admission: RE | Admit: 2023-03-21 | Discharge: 2023-03-21 | Disposition: A | Discharge status: Home / Self Care | Payer: BC Managed Care – PPO | Source: Ambulatory Visit | Attending: Family Medicine | Admitting: Family Medicine

## 2023-03-21 DIAGNOSIS — Z1231 Encounter for screening mammogram for malignant neoplasm of breast: Secondary | ICD-10-CM

## 2023-09-02 DIAGNOSIS — E7800 Pure hypercholesterolemia, unspecified: Secondary | ICD-10-CM | POA: Diagnosis not present

## 2023-09-02 DIAGNOSIS — Z0000 Encounter for general adult medical examination without abnormal findings: Secondary | ICD-10-CM | POA: Diagnosis not present

## 2023-09-02 DIAGNOSIS — E042 Nontoxic multinodular goiter: Secondary | ICD-10-CM | POA: Diagnosis not present

## 2023-09-02 DIAGNOSIS — F411 Generalized anxiety disorder: Secondary | ICD-10-CM | POA: Diagnosis not present

## 2024-04-09 ENCOUNTER — Other Ambulatory Visit: Payer: Self-pay | Admitting: Family Medicine

## 2024-04-09 DIAGNOSIS — Z1231 Encounter for screening mammogram for malignant neoplasm of breast: Secondary | ICD-10-CM

## 2024-04-22 ENCOUNTER — Ambulatory Visit
Admission: RE | Admit: 2024-04-22 | Discharge: 2024-04-22 | Disposition: A | Source: Ambulatory Visit | Attending: Family Medicine | Admitting: Family Medicine

## 2024-04-22 DIAGNOSIS — Z1231 Encounter for screening mammogram for malignant neoplasm of breast: Secondary | ICD-10-CM
